# Patient Record
Sex: Female | Born: 1935 | Race: White | Hispanic: No | Smoking: Never smoker
Health system: Southern US, Community
[De-identification: ages and names within clinical notes are randomized; demographics above are authoritative.]

## PROBLEM LIST (undated history)

## (undated) ENCOUNTER — Emergency Department: Admission: EM | Payer: Medicare Other | Source: Home / Self Care

## (undated) DIAGNOSIS — J45909 Unspecified asthma, uncomplicated: Secondary | ICD-10-CM

## (undated) DIAGNOSIS — K219 Gastro-esophageal reflux disease without esophagitis: Secondary | ICD-10-CM

## (undated) DIAGNOSIS — M1990 Unspecified osteoarthritis, unspecified site: Secondary | ICD-10-CM

## (undated) HISTORY — PX: ABDOMINAL HYSTERECTOMY: SHX81

## (undated) HISTORY — PX: BRONCHOSCOPY: SUR163

## (undated) HISTORY — PX: BREAST SURGERY: SHX581

## (undated) HISTORY — PX: NASAL SINUS SURGERY: SHX719

---

## 2011-09-07 ENCOUNTER — Emergency Department (INDEPENDENT_AMBULATORY_CARE_PROVIDER_SITE_OTHER)
Admission: EM | Admit: 2011-09-07 | Discharge: 2011-09-07 | Disposition: A | Discharge status: Home / Self Care | Payer: Medicare Other | Source: Home / Self Care | Attending: Emergency Medicine | Admitting: Emergency Medicine

## 2011-09-07 ENCOUNTER — Encounter: Payer: Self-pay | Admitting: *Deleted

## 2011-09-07 DIAGNOSIS — S058X9A Other injuries of unspecified eye and orbit, initial encounter: Secondary | ICD-10-CM

## 2011-09-07 DIAGNOSIS — S0500XA Injury of conjunctiva and corneal abrasion without foreign body, unspecified eye, initial encounter: Secondary | ICD-10-CM

## 2011-09-07 HISTORY — DX: Unspecified osteoarthritis, unspecified site: M19.90

## 2011-09-07 MED ORDER — POLYMYXIN B-TRIMETHOPRIM 10000-0.1 UNIT/ML-% OP SOLN: 1.0000 [drp] | Freq: Four times a day (QID) | OPHTHALMIC | Status: AC

## 2011-09-07 NOTE — ED Notes (Signed)
Pt c/o possible foreign body LT eye x last night. She has a hx of bilateral cataract.

## 2011-09-07 NOTE — ED Provider Notes (Signed)
History     CSN: [id number] Arrival date & time: 09/07/2011 10:23 AM   First MD Initiated Contact with Patient 09/07/11 1013      Chief Complaint  Patient presents with  . Eye Injury    (Consider location/radiation/quality/duration/timing/severity/associated sxs/prior treatment) HPI This patient is here complaining of a possible foreign body in her left eye since last night. She was cleaning in her room and feels that maybe some dust got in her eye. She does have a history of bilateral cataracts. She noticed a little bit of discomfort and some redness this morning and is wondering if the object is still and dry. She does not complain of any blurry vision or pain.  She does wear glasses for reading, however she would like a referral to see an ophthalmologist to talk about her cataracts.  Past Medical History  Diagnosis Date  . Allergic arthritis   . Osteoarthritis   . Cataract     bilateral    Past Surgical History  Procedure Date  . Abdominal hysterectomy   . Bronchoscopy   . Breast surgery     2 biopsy    Family History  Problem Relation Age of Onset  . Alzheimer's disease Mother   . Osteoarthritis Mother     History  Substance Use Topics  . Smoking status: Never Smoker   . Smokeless tobacco: Not on file  . Alcohol Use: No    OB History    Grav Para Term Preterm Abortions TAB SAB Ect Mult Living                  Review of Systems  Allergies  Aspirin and Zithromax  Home Medications   Current Outpatient Rx  Name Route Sig Dispense Refill  . FLUTICASONE PROPIONATE  HFA 110 MCG/ACT IN AERO Inhalation Inhale 1 puff into the lungs 2 (two) times daily.      Marland Kitchen FORMOTEROL FUMARATE 12 MCG IN CAPS Inhalation Place 12 mcg into inhaler and inhale 2 (two) times daily.      Marland Kitchen HYPROMELLOSE 2.5 % OP SOLN  1 drop.      Marland Kitchen POLYMYXIN B-TRIMETHOPRIM 10000-0.1 UNIT/ML-% OP SOLN Ophthalmic Apply 1 drop to eye every 6 (six) hours. 10 mL 0    BP 125/72  Pulse 94   Temp(Src) 97.9 F (36.6 C) (Oral)  Resp 18  Ht 5\' 2"  (1.575 m)  Wt 132 lb 4 oz (59.988 kg)  BMI 24.19 kg/m2  SpO2 98%  Physical Exam  Nursing note and vitals reviewed. Constitutional: She is oriented to person, place, and time. She appears well-developed and well-nourished.  HENT:  Head: Normocephalic and atraumatic.  Eyes: EOM and lids are normal. Pupils are equal, round, and reactive to light. No foreign bodies found. Left conjunctiva is injected. Left conjunctiva has no hemorrhage.  Slit lamp exam:      The left eye shows corneal abrasion and fluorescein uptake. The left eye shows no corneal ulcer.         Small corneal abrasion at 6 o'clock.  No dendrites.  Neck: Neck supple.  Cardiovascular: Regular rhythm and normal heart sounds.   Pulmonary/Chest: Effort normal and breath sounds normal. No respiratory distress.  Neurological: She is alert and oriented to person, place, and time.  Skin: Skin is warm and dry.  Psychiatric: She has a normal mood and affect. Her speech is normal.    ED Course  Procedures (including critical care time)  Labs Reviewed - No data to display  No results found.   1. Corneal abrasion       MDM   This patient with a left very small corneal abrasion on fluorescein examination.  I have given her a prescription for Polytrim to use for the next few days to prevent any infection and to lubricate her eyes. In addition I have given her a referral to Dr. Jackquline Bosch ophthalmology to discuss her cataracts. If she is having any further problems she should either call us or call her eye doctor. However I do believe that her irritation should be resolved within a day or day and a half at the most.    Lily Kocher, MD 09/07/11 1044

## 2014-07-10 ENCOUNTER — Encounter: Payer: Self-pay | Admitting: Physician Assistant

## 2014-07-10 ENCOUNTER — Ambulatory Visit (INDEPENDENT_AMBULATORY_CARE_PROVIDER_SITE_OTHER): Payer: Medicare Other | Admitting: Physician Assistant

## 2014-07-10 VITALS — BP 112/65 | HR 90 | Ht 62.5 in | Wt 133.0 lb

## 2014-07-10 DIAGNOSIS — J4520 Mild intermittent asthma, uncomplicated: Secondary | ICD-10-CM

## 2014-07-10 DIAGNOSIS — H269 Unspecified cataract: Secondary | ICD-10-CM

## 2014-07-10 DIAGNOSIS — K219 Gastro-esophageal reflux disease without esophagitis: Secondary | ICD-10-CM

## 2014-07-10 DIAGNOSIS — R002 Palpitations: Secondary | ICD-10-CM

## 2014-07-10 DIAGNOSIS — J45909 Unspecified asthma, uncomplicated: Secondary | ICD-10-CM | POA: Insufficient documentation

## 2014-07-10 DIAGNOSIS — R000 Tachycardia, unspecified: Secondary | ICD-10-CM

## 2014-07-10 NOTE — Progress Notes (Signed)
   Subjective:    Patient ID: Brandy Wright, female    DOB: 1933/09/08, 80 y.o.   MRN: [id number]  HPI Pt is a 80 yo female who presents to the clinic with her daughter. She would like to establish care. She also would like to follow up after ER visit for palpitations on 07/06/2014. She went to ED because she felt like her pulse was racing. EKG, CXR was all normal. Pulse was a little elevated on arrival but quickly came done. Nothing was done or given to her. She has continue to check pulse and staying around 90-100. Does not feel any overt palpitations. Denies any CP, jaw pain, HA, vision changes.   .. Active Ambulatory Problems    Diagnosis Date Noted  . Cataracts, bilateral 07/10/2014  . Asthma, chronic 07/10/2014  . GERD (gastroesophageal reflux disease) 07/10/2014  . Palpitations 07/10/2014  . Tachycardia 07/10/2014   Resolved Ambulatory Problems    Diagnosis Date Noted  . No Resolved Ambulatory Problems   Past Medical History  Diagnosis Date  . Allergic arthritis   . Osteoarthritis   . Cataract    .Marland Kitchen Family History  Problem Relation Age of Onset  . Alzheimer's disease Mother   . Osteoarthritis Mother   . Cancer Mother     colon  . Cancer Father     colon  . Multiple sclerosis Brother   . Myasthenia gravis Brother   . Myasthenia gravis Brother    .Marland Kitchen History   Social History  . Marital Status: Divorced    Spouse Name: N/A    Number of Children: N/A  . Years of Education: N/A   Occupational History  . Not on file.   Social History Main Topics  . Smoking status: Never Smoker   . Smokeless tobacco: Not on file  . Alcohol Use: No  . Drug Use: No  . Sexual Activity: Not Currently   Other Topics Concern  . Not on file   Social History Narrative  . No narrative on file       Review of Systems  All other systems reviewed and are negative.      Objective:   Physical Exam  Constitutional: She is oriented to person, place, and time. She appears  well-developed and well-nourished.  HENT:  Head: Normocephalic and atraumatic.  Cardiovascular: Normal rate, regular rhythm and normal heart sounds.   Pulmonary/Chest: Effort normal and breath sounds normal.  Neurological: She is alert and oriented to person, place, and time.  Skin: Skin is dry.  Psychiatric: She has a normal mood and affect. Her behavior is normal.          Assessment & Plan:  Palpitations/Tachycardia- reassured pt that pulse looked great today. Will get holter monitor for further confirmation.   Asthma- controlled and managed by specialist.   Pt declined flu shot, Tdap, colonoscopy, and mammogram.   Risk were discussed and pt assumes them.

## 2014-07-22 ENCOUNTER — Encounter (INDEPENDENT_AMBULATORY_CARE_PROVIDER_SITE_OTHER): Payer: Medicare Other

## 2014-07-22 ENCOUNTER — Encounter: Payer: Self-pay | Admitting: *Deleted

## 2014-07-22 DIAGNOSIS — R000 Tachycardia, unspecified: Secondary | ICD-10-CM

## 2014-07-22 DIAGNOSIS — R002 Palpitations: Secondary | ICD-10-CM

## 2014-07-22 NOTE — Progress Notes (Signed)
Patient ID: Brandy Wright, female   DOB: 17-May-1933, 80 y.o.   MRN: [id number] [employer] 24 hour holter monitor applied to patient.

## 2014-08-07 ENCOUNTER — Telehealth: Payer: Self-pay | Admitting: Physician Assistant

## 2014-08-07 NOTE — Telephone Encounter (Signed)
Brandy Wright will you call pt: make her aware holter monitor result are in. Showed normal sinus rhythm with rare premature ventricular contractions. This is not worrisome. Certainly staying away from caffiene, alcohol could help. There are medicines that can help if PVC become worrisome to you. Please follow up with any questions.

## 2014-08-19 NOTE — Telephone Encounter (Signed)
Pt notified of results

## 2014-10-16 ENCOUNTER — Ambulatory Visit (INDEPENDENT_AMBULATORY_CARE_PROVIDER_SITE_OTHER): Payer: Medicare Other | Admitting: Physician Assistant

## 2014-10-16 ENCOUNTER — Encounter: Payer: Self-pay | Admitting: Physician Assistant

## 2014-10-16 VITALS — BP 122/57 | HR 92 | Ht 62.5 in | Wt 133.0 lb

## 2014-10-16 DIAGNOSIS — Z131 Encounter for screening for diabetes mellitus: Secondary | ICD-10-CM

## 2014-10-16 DIAGNOSIS — K2100 Gastro-esophageal reflux disease with esophagitis, without bleeding: Secondary | ICD-10-CM

## 2014-10-16 DIAGNOSIS — Z0000 Encounter for general adult medical examination without abnormal findings: Secondary | ICD-10-CM

## 2014-10-16 DIAGNOSIS — Z13220 Encounter for screening for lipoid disorders: Secondary | ICD-10-CM

## 2014-10-16 MED ORDER — PANTOPRAZOLE SODIUM 40 MG PO TBEC: 40.0000 mg | DELAYED_RELEASE_TABLET | Freq: Every day | ORAL | Status: DC

## 2014-10-16 NOTE — Progress Notes (Signed)
   Subjective:    Patient ID: Brandy Wright, female    DOB: Jul 08, 1933, 80 y.o.   MRN: [id number]  HPI  Patient presents to the clinic to discuss acid reflux. She is currently taking antacids multiple times during the day to help with her acid reflux symptoms. She denies any overt abdominal pain. She occasionally feels a little belching or gas see if that pain. She denies any blood in stool or black tarry stools. She has not been wanting to take omeprazole because she feels like it does speed up her heart. She would like to try something else.  She is also concerned because she is running out of her asthma inhalers due to cost. She sees Dr. Vickki Muff her pulmonologist. She is called their office and no answer. She feels like there might be something cheaper that she can get to control her symptoms.    Review of Systems  All other systems reviewed and are negative.      Objective:   Physical Exam  Constitutional: She is oriented to person, place, and time. She appears well-developed and well-nourished.  HENT:  Head: Normocephalic and atraumatic.  Cardiovascular: Normal rate, regular rhythm and normal heart sounds.   Pulmonary/Chest: Effort normal and breath sounds normal.  Abdominal: Soft. Bowel sounds are normal. She exhibits no distension and no mass. There is no tenderness. There is no rebound and no guarding.  Neurological: She is alert and oriented to person, place, and time.  Skin: Skin is dry.  Psychiatric: She has a normal mood and affect. Her behavior is normal.          Assessment & Plan:  Asthma- she's been followed by Dr. Vickki Muff in currently controlled. Patient is concerned because she will run out of her medications and inhalers on Sunday. She has tried calling their office. The cost of these medicines will be too much in the new year and she wonders if there is an alternative. I discussed with her that we will call Dr. Alvera Singh office and see if there is anything they can  send that is comparable so that she will not have to run out her medications. If not I did suggest a follow-up in person with Dr. Vickki Muff.  GERD-patient is very anxious about starting or taking any new medication. She is using a lot of antacids throughout the day. I discussed with her that one tablet in the morning might be just what she needs to use the over-the-counter antacids throughout the day. We will try protonic since on her formulary. Follow-up as needed. I did give patient a GERD diet to help control some of her symptoms.  We did discuss some preventative healthcare. She once again declined mammogram/colonoscopy/flu shot/tetanus shot. I did order some fasting labs and encouraged her to have these drawn at her convenience and follow-up.

## 2014-10-16 NOTE — Patient Instructions (Addendum)
Will call [facility] about preferred asthma drugs.   Food Choices for Gastroesophageal Reflux Disease When you have gastroesophageal reflux disease (GERD), the foods you eat and your eating habits are very important. Choosing the right foods can help ease the discomfort of GERD. WHAT GENERAL GUIDELINES DO I NEED TO FOLLOW?  Choose fruits, vegetables, whole grains, low-fat dairy products, and low-fat meat, fish, and poultry.  Limit fats such as oils, salad dressings, butter, nuts, and avocado.  Keep a food diary to identify foods that cause symptoms.  Avoid foods that cause reflux. These may be different for different people.  Eat frequent small meals instead of three large meals each day.  Eat your meals slowly, in a relaxed setting.  Limit fried foods.  Cook foods using methods other than frying.  Avoid drinking alcohol.  Avoid drinking large amounts of liquids with your meals.  Avoid bending over or lying down until 2-3 hours after eating. WHAT FOODS ARE NOT RECOMMENDED? The following are some foods and drinks that may worsen your symptoms: Vegetables Tomatoes. Tomato juice. Tomato and spaghetti sauce. Chili peppers. Onion and garlic. Horseradish. Fruits Oranges, grapefruit, and lemon (fruit and juice). Meats High-fat meats, fish, and poultry. This includes hot dogs, ribs, ham, sausage, salami, and bacon. Dairy Whole milk and chocolate milk. Sour cream. Cream. Butter. Ice cream. Cream cheese.  Beverages Coffee and tea, with or without caffeine. Carbonated beverages or energy drinks. Condiments Hot sauce. Barbecue sauce.  Sweets/Desserts Chocolate and cocoa. Donuts. Peppermint and spearmint. Fats and Oils High-fat foods, including [place] fries and potato chips. Other Vinegar. Strong spices, such as black pepper, white pepper, red pepper, cayenne, curry powder, cloves, ginger, and chili powder. The items listed above may not be a complete list of foods and  beverages to avoid. Contact your dietitian for more information. Document Released: 10/04/2005 Document Revised: 10/09/2013 Document Reviewed: 08/08/2013 [facility] Patient Information 2015 [place], [place]. This information is not intended to replace advice given to you by your health care provider. Make sure you discuss any questions you have with your health care provider.

## 2015-01-08 ENCOUNTER — Ambulatory Visit (INDEPENDENT_AMBULATORY_CARE_PROVIDER_SITE_OTHER): Payer: Medicare Other | Admitting: Physician Assistant

## 2015-01-08 ENCOUNTER — Encounter: Payer: Self-pay | Admitting: Physician Assistant

## 2015-01-08 VITALS — BP 109/56 | HR 97 | Ht 62.5 in | Wt 127.0 lb

## 2015-01-08 DIAGNOSIS — R000 Tachycardia, unspecified: Secondary | ICD-10-CM | POA: Diagnosis not present

## 2015-01-08 DIAGNOSIS — J4520 Mild intermittent asthma, uncomplicated: Secondary | ICD-10-CM

## 2015-01-08 MED ORDER — ALBUTEROL SULFATE HFA 108 (90 BASE) MCG/ACT IN AERS: 2.0000 | INHALATION_SPRAY | Freq: Four times a day (QID) | RESPIRATORY_TRACT | Status: DC | PRN

## 2015-01-08 MED ORDER — FLUTICASONE PROPIONATE HFA 110 MCG/ACT IN AERO: 2.0000 | INHALATION_SPRAY | Freq: Two times a day (BID) | RESPIRATORY_TRACT | Status: DC

## 2015-01-09 LAB — LIPID PANEL
Cholesterol: 170 mg/dL (ref 0–200)
HDL: 41 mg/dL — ABNORMAL LOW (ref >=46)
LDL Cholesterol: 109 mg/dL — ABNORMAL HIGH (ref 0–99)
Total CHOL/HDL Ratio: 4.1 Ratio
Triglycerides: 101 mg/dL (ref <150)
VLDL: 20 mg/dL (ref 0–40)

## 2015-01-09 LAB — COMPLETE METABOLIC PANEL WITH GFR
ALT: 10 U/L (ref 0–35)
AST: 17 U/L (ref 0–37)
Albumin: 3.9 g/dL (ref 3.5–5.2)
Alkaline Phosphatase: 86 U/L (ref 39–117)
BUN: 6 mg/dL (ref 6–23)
CO2: 32 mEq/L (ref 19–32)
Calcium: 8.9 mg/dL (ref 8.4–10.5)
Chloride: 101 mEq/L (ref 96–112)
Creat: 0.63 mg/dL (ref 0.50–1.10)
GFR, Est African American: >89 mL/min
GFR, Est Non African American: 86 mL/min
Glucose, Bld: 73 mg/dL (ref 70–99)
Potassium: 4.4 mEq/L (ref 3.5–5.3)
Sodium: 141 mEq/L (ref 135–145)
Total Bilirubin: 1.3 mg/dL — ABNORMAL HIGH (ref 0.2–1.2)
Total Protein: 6.4 g/dL (ref 6.0–8.3)

## 2015-01-10 NOTE — Progress Notes (Signed)
   Subjective:    Patient ID: Brandy Wright, female    DOB: Mar 10, 1933, 81 y.o.   MRN: [id number]  HPI Pt is a 81 yo woman who presents to the clinic for medication refills on asthma medications. She is doing well currently on flovent 1 puff bid and albuterol as needed. Not using albuterol every day but new season has caused her to use a little more. No SOB or cough today.   She does mention her elevated pulse at times makes her very anxious. She does not know any triggers. No CP, headaches, vision changes, or SOB with pulse. She does do relaxation techniques.    Review of Systems  All other systems reviewed and are negative.      Objective:   Physical Exam  Constitutional: She is oriented to person, place, and time. She appears well-developed and well-nourished.  HENT:  Head: Normocephalic and atraumatic.  Cardiovascular: Normal rate, regular rhythm and normal heart sounds.   Pulmonary/Chest: Effort normal and breath sounds normal. She has no wheezes.  Neurological: She is alert and oriented to person, place, and time.  Skin: Skin is dry.  Psychiatric: She has a normal mood and affect. Her behavior is normal.          Assessment & Plan:  Asthma- refilled medications. Discussed during bad season can go up to 2 puffs twice a day on flovent. Use albuterol as least as you can. Encouraged to add a daily zyrtec during the spring season at night without D.   Tachycardia- explained she could be getting elevated HR after using albuterol. Certainly today not too concerning. Start keeping a log and if we need to can start medication to lower pulse to help decrease anxiety. She brings up valium for anxiety but i would like to stay away from this medication at this time due to age and her living alone. Bring in logs and can elavalute pulse more.

## 2015-04-18 ENCOUNTER — Ambulatory Visit (INDEPENDENT_AMBULATORY_CARE_PROVIDER_SITE_OTHER): Payer: Medicare Other | Admitting: Family Medicine

## 2015-04-18 ENCOUNTER — Encounter: Payer: Self-pay | Admitting: Family Medicine

## 2015-04-18 VITALS — BP 122/63 | HR 88 | Wt 123.0 lb

## 2015-04-18 DIAGNOSIS — R0981 Nasal congestion: Secondary | ICD-10-CM

## 2015-04-18 DIAGNOSIS — K219 Gastro-esophageal reflux disease without esophagitis: Secondary | ICD-10-CM | POA: Diagnosis not present

## 2015-04-18 MED ORDER — IPRATROPIUM BROMIDE 0.03 % NA SOLN: 2.0000 | Freq: Two times a day (BID) | NASAL | Status: DC

## 2015-04-18 NOTE — Progress Notes (Signed)
CC: Brandy Wright is a 81 y.o. female is here for f/u acid refux   Subjective: HPI:  Follow-up GERD: She decided not to take Protonix due to a fear that it would cause side effects similar to that shakes during some omeprazole. Omeprazole causes a racing heartbeat with a resting heartbeat of greater than 100. Once she stopped taking his medication the heart rate back into the normal range below 100 when resting. She tells me she gets a burning sensation on the back of her sternum that radiates up into the back of the mouth if she lies down flat. Symptoms are slightly improved if she takes over-the-counter acid suppression medication that sounds to be like tums but not ranitidine. Symptoms are currently mild in severity and present most days of the week. She denies abdominal pain, unintentional weight loss appetite suppression or change in voice.  Her major complaint today is nasal congestion has been present for matter of months. She tells me she uses somewhere around 100 tissues a day for this. It's draining only through the front and seems to block her right nostril more than the left. Nothing seems to make it better or worse. She's tried Nasonex, Nasacort and just started Flonase to see if this would help, she is all of these individually never together. She denies fevers, chills, cough nor shortness of breath   Review Of Systems Outlined In HPI  Past Medical History  Diagnosis Date  . Allergic arthritis   . Osteoarthritis   . Cataract     bilateral    Past Surgical History  Procedure Laterality Date  . Abdominal hysterectomy    . Bronchoscopy    . Breast surgery      2 biopsy   Family History  Problem Relation Age of Onset  . Alzheimer's disease Mother   . Osteoarthritis Mother   . Cancer Mother     colon  . Cancer Father     colon  . Multiple sclerosis Brother   . Myasthenia gravis Brother   . Myasthenia gravis Brother     History   Social History  . Marital Status:  Divorced    Spouse Name: N/A  . Number of Children: N/A  . Years of Education: N/A   Occupational History  . Not on file.   Social History Main Topics  . Smoking status: Never Smoker   . Smokeless tobacco: Not on file  . Alcohol Use: No  . Drug Use: No  . Sexual Activity: Not Currently   Other Topics Concern  . Not on file   Social History Narrative     Objective: BP 122/63 mmHg  Pulse 88  Wt 123 lb (55.792 kg)  General: Alert and Oriented, No Acute Distress HEENT: Pupils equal, round, reactive to light. Conjunctivae clear.  External ears unremarkable, canals clear with intact TMs with appropriate landmarks.  Middle ear appears open without effusion. Pink inferior turbinates.  Moist mucous membranes, pharynx without inflammation nor lesions.  Neck supple without palpable lymphadenopathy nor abnormal masses. Lungs: Clear comfortable work of breathing Cardiac: Regular rate and rhythm.  Extremities: No peripheral edema.  Strong peripheral pulses.  Mental Status: No depression, anxiety, nor agitation. Skin: Warm and dry.  Assessment & Plan: Brandy Wright was seen today for f/u acid refux.  Diagnoses and all orders for this visit:  Gastroesophageal reflux disease, esophagitis presence not specified  Nasal congestion Orders: -     ipratropium (ATROVENT) 0.03 % nasal spray; Place 2 sprays into both  nostrils every 12 (twelve) hours.   GERD: Uncontrolled chronic condition, she essentially declines all PPIs. She is not interested in ranitidine. She prefers to continue with over-the-counter Tums. Nasal congestion: Start Atrovent this can be taken with any nasal cortical steroidal of her choosing.  Return if symptoms worsen or fail to improve.

## 2015-06-30 ENCOUNTER — Encounter: Payer: Self-pay | Admitting: Physician Assistant

## 2015-06-30 ENCOUNTER — Ambulatory Visit (INDEPENDENT_AMBULATORY_CARE_PROVIDER_SITE_OTHER): Payer: Medicare Other | Admitting: Physician Assistant

## 2015-06-30 VITALS — BP 140/65 | HR 97 | Temp 97.7°F | Ht 62.5 in | Wt 125.0 lb

## 2015-06-30 DIAGNOSIS — R0981 Nasal congestion: Secondary | ICD-10-CM | POA: Diagnosis not present

## 2015-06-30 DIAGNOSIS — H6123 Impacted cerumen, bilateral: Secondary | ICD-10-CM

## 2015-06-30 DIAGNOSIS — J0100 Acute maxillary sinusitis, unspecified: Secondary | ICD-10-CM

## 2015-06-30 MED ORDER — AMOXICILLIN 500 MG PO CAPS: 500.0000 mg | ORAL_CAPSULE | Freq: Three times a day (TID) | ORAL | Status: DC

## 2015-06-30 MED ORDER — METHYLPREDNISOLONE SODIUM SUCC 125 MG IJ SOLR
125.0000 mg | Freq: Once | INTRAMUSCULAR | Status: AC
Start: 2015-06-30 — End: 2015-06-30
  Administered 2015-06-30: 125 mg via INTRAMUSCULAR

## 2015-06-30 MED ORDER — FLUTICASONE PROPIONATE HFA 110 MCG/ACT IN AERO: 2.0000 | INHALATION_SPRAY | Freq: Two times a day (BID) | RESPIRATORY_TRACT | Status: DC

## 2015-06-30 MED ORDER — BECLOMETHASONE DIPROPIONATE 80 MCG/ACT NA AERS: INHALATION_SPRAY | NASAL | Status: DC

## 2015-06-30 NOTE — Addendum Note (Signed)
Addended by: Beatris Ship L on: 06/30/2015 11:26 AM   Modules accepted: Orders

## 2015-06-30 NOTE — Addendum Note (Signed)
Addended by: Beatris Ship L on: 06/30/2015 10:54 AM   Modules accepted: Orders

## 2015-06-30 NOTE — Progress Notes (Addendum)
   Subjective:    Patient ID: Brandy Wright, female    DOB: 03-25-1933, 81 y.o.   MRN: [id number]  HPI CC: nasal congestion  Patient presents to clinic with nasal congestion lasting "a few weeks". She reports pressure in maxillary sinuses and ears. She denies symptoms in frontal sinuses. She states she frequently expels mucus which is yellow/green and thick in quality. She reports symptoms are accompanied by chest tightness. OTC antihistamines provide mild relief. She was seen at onset of symptoms and prescribed atrovent, which she is not taking due to side effects of dizziness. She denies chest pain, SOB, cough, or sore throat.   .. Past Medical History  Diagnosis Date  . Allergic arthritis   . Osteoarthritis   . Cataract     bilateral    Review of Systems    Positive for symptoms listed in HPI. Objective:   Physical Exam  Constitutional: Patient is alert, oriented and cooperative. Ears: Right TM is erythematous with significant cerumen present in external cannel. Left external cannel is completely occluded by cerumen after removal. TM's erythematous with some fluid behind TM. No blood or pus.  Nose: Nares mildly occluded. Nasal mucosa erythematous with yellow mucus observed. Tenderness over bilateral maxillary sinuses.  Throat: Pharynx is not erythematous and without exudate Lymph: No nodes palpated Cardiac: S1, S2 auscultated. No M/R/G Pulm: CTAB       Assessment & Plan:  Sinusitis- Patient reports congestion with yellow discharge. On PE, maxillary sinuses were tender to palpation and erythematous nasal mucosa observed. Administer injection of solumedrol '125mg'$  IM.  Rx amox.icillin for 10 days given.   Nasal congestion- qnasl given sample to try.   Cerumen impaction- Clean out external cannel with saline rinse bilaterally   Asthma- refilled flovent.

## 2015-06-30 NOTE — Patient Instructions (Signed)
Try qnasl.  Start amoxil.  Continue allergy medications.  Call if not improving will consider CT scan.

## 2015-12-09 ENCOUNTER — Encounter: Payer: Self-pay | Admitting: Physician Assistant

## 2015-12-09 ENCOUNTER — Ambulatory Visit (INDEPENDENT_AMBULATORY_CARE_PROVIDER_SITE_OTHER): Payer: Medicare Other | Admitting: Physician Assistant

## 2015-12-09 VITALS — BP 104/48 | HR 104 | Temp 97.9°F | Ht 62.5 in | Wt 118.0 lb

## 2015-12-09 DIAGNOSIS — R0981 Nasal congestion: Secondary | ICD-10-CM

## 2015-12-09 DIAGNOSIS — J0100 Acute maxillary sinusitis, unspecified: Secondary | ICD-10-CM | POA: Diagnosis not present

## 2015-12-09 MED ORDER — AMOXICILLIN-POT CLAVULANATE 875-125 MG PO TABS: 1.0000 | ORAL_TABLET | Freq: Two times a day (BID) | ORAL | Status: DC

## 2015-12-09 MED ORDER — AZELASTINE HCL 0.1 % NA SOLN: 2.0000 | Freq: Two times a day (BID) | NASAL | Status: DC

## 2015-12-09 NOTE — Patient Instructions (Signed)
Azelastine nasal spray What is this medicine? AZELASTINE (a ZEL as teen) nasal spray is a histamine blocker. It helps to relieve itching, running and stuffiness in the nose. This medicine is used to treat nasal symptoms from allergies and other irritants. This medicine may be used for other purposes; ask your health care provider or pharmacist if you have questions. What should I tell my health care provider before I take this medicine? They need to know if you have any of these conditions: -any other medical problems -an unusual or allergic reaction to azelastine, other nasal sprays, medicines, foods, dyes, or preservatives -pregnant or trying to become pregnant -breast-feeding How should I use this medicine? This medicine is for use only in the nose. Follow the directions on the prescription label. Do not use more often than directed. Make sure that you are using your inhaler correctly. Ask you doctor or health care provider if you have any questions. Talk to your pediatrician regarding the use of this medicine in children. While some products may be prescribed for children as young as 6 months for selected conditions, precautions do apply. Overdosage: If you think you have taken too much of this medicine contact a poison control center or emergency room at once. NOTE: This medicine is only for you. Do not share this medicine with others. What if I miss a dose? If you miss a dose, use it as soon as you can. If it is almost time for your next dose, use only that dose. Do not use double or extra doses. What may interact with this medicine? -cimetidine -other antihistamines This list may not describe all possible interactions. Give your health care provider a list of all the medicines, herbs, non-prescription drugs, or dietary supplements you use. Also tell them if you smoke, drink alcohol, or use illegal drugs. Some items may interact with your medicine. What should I watch for while using this  medicine? Tell your doctor or health care professional if your symptoms do not improve. Do not use extra medicine. Do not spray in your eyes. This medicine may make you feel confused, dizzy or lightheaded. Drinking alcohol or taking medicine that causes drowsiness can make this worse. Do not drive, use machinery, or do anything that needs mental alertness until you know how this medicine affects you. What side effects may I notice from receiving this medicine? Side effects that you should report to your doctor or health care professional as soon as possible: -allergic reactions like skin rash, itching or hives, swelling of the face, lips, or tongue -breathing problems -fast heartbeat -high blood pressure -infection Side effects that usually do not require medical attention (report to your doctor or health care professional if they continue or are bothersome): -bitter taste -cough -feeling tired -headache -larger appetite or weight gain -nose or throat irritation -nosebleed -sneezing This list may not describe all possible side effects. Call your doctor for medical advice about side effects. You may report side effects to FDA at 1-800-FDA-1088. Where should I keep my medicine? Keep out of the reach of children. Store upright and tightly closed at room temperature between 20 and 25 degrees C (68 and 77 degrees F). Do not freeze. Throw away any unused medicine after the expiration date or after 200 sprays, whichever comes first. NOTE: This sheet is a summary. It may not cover all possible information. If you have questions about this medicine, talk to your doctor, pharmacist, or health care provider.    2016, Elsevier/Gold Standard. (  2013-12-11 15:52:44)  

## 2015-12-09 NOTE — Progress Notes (Addendum)
   Subjective:    Patient ID: Brandy Wright, female    DOB: 08-22-1933, 82 y.o.   MRN: [id number]  HPI  Patient is an 82 year old female that presents with nasal congestion for the past two weeks. Patient has also had a non-productive cough and sore throat. Patient has a past medical history relevant for recurrent sinusitis. Patient experiences occasional shortness of breath, which she associates with asthma. Patient has occasional non-purulent rhinorrhea. Patient denies body aches but has felt tired. Patient denies fever and chills. Patient denies chest pain, nausea, vomiting, dysuria, hematuria, constipation and diarrhea. Patient has tried phenylephrine, which has provided her with some relief. Patient states that phenylephrine makes her feel "disoriented".   Review of Systems   Please see HPI Objective:   Physical Exam  Constitutional: She is oriented to person, place, and time. She appears well-developed and well-nourished.  HENT:  Head: Normocephalic and atraumatic.  Right Ear: External ear normal.  Left Ear: External ear normal.  Patient's TMs are traunslucent with bony landmarks visualized.  Patient's posterior pharynx is erythematous with cobblestoning visualized.   Bilateral turbinates pale and swollen.  Eyes: Conjunctivae and EOM are normal. Pupils are equal, round, and reactive to light.  Neck: Normal range of motion. No thyromegaly present.  Cardiovascular: Normal rate, regular rhythm, normal heart sounds and intact distal pulses.   Pulmonary/Chest: Effort normal and breath sounds normal. No respiratory distress. She has no wheezes. She has no rales.  Abdominal: Soft. Bowel sounds are normal. She exhibits no distension. There is no tenderness.  Musculoskeletal: Normal range of motion.  Neurological: She is alert and oriented to person, place, and time. She has normal reflexes.  Skin: Skin is warm and dry.  Psychiatric: She has a normal mood and affect. Her behavior is  normal. Judgment and thought content normal.     Assessment & Plan:   1. Acute Sinusitis with nasal congestion Patient has had nasal congestion and fatigue. Patient has a past medical history of recurrent sinusitis. Stop sudafed. Patient was treated with 875 mg /125 mg PO q12h for 7 days. Patient was also started on Astelin nasal spray. Patient was advised to follow up for a nurse's visit on 12/12/15 for a solumedrol injection if symptoms do not improve.

## 2016-02-26 ENCOUNTER — Encounter: Payer: Self-pay | Admitting: Physician Assistant

## 2016-02-26 ENCOUNTER — Ambulatory Visit (INDEPENDENT_AMBULATORY_CARE_PROVIDER_SITE_OTHER): Payer: Medicare Other | Admitting: Physician Assistant

## 2016-02-26 VITALS — BP 126/40 | HR 97 | Ht 62.5 in | Wt 117.0 lb

## 2016-02-26 DIAGNOSIS — R0981 Nasal congestion: Secondary | ICD-10-CM | POA: Diagnosis not present

## 2016-02-26 MED ORDER — METHYLPREDNISOLONE SODIUM SUCC 125 MG IJ SOLR
125.0000 mg | Freq: Once | INTRAMUSCULAR | Status: AC
  Administered 2016-02-26: 125 mg via INTRAMUSCULAR

## 2016-02-26 NOTE — Patient Instructions (Signed)
Zyrtec daily.   Referral to ENT.   If you are not improving suggest CT scan of sinuses to look for chronic sinusitis.

## 2016-02-26 NOTE — Progress Notes (Addendum)
   Subjective:    Patient ID: Brandy Wright, female    DOB: [birth year], 82 y.o.   MRN: [id number]  HPI Patient is here today complaining of ongoing congestion for the past several months. She was seen in 12/09/15, diagnosed with a maxillary sinusitis, and given Augmentin for treatment. She states she never got completely better after that antibiotic, and has continued to struggle with nasal congestion. Denies fever, chills, headache, sore throat, chest tightness, shortness of breath and wheezing. She does reports some ear pressure. She has not tried any medicine at home, but has been using the Azelastine inhaler twice daily with some improvement.    Review of Systems  All other systems reviewed and are negative.      Objective:   Physical Exam  Constitutional: She is oriented to person, place, and time. She appears well-developed and well-nourished.  HENT:  Head: Normocephalic and atraumatic.  Left Ear: External ear normal.  Nose: Mucosal edema and rhinorrhea present. Right sinus exhibits frontal sinus tenderness. Right sinus exhibits no maxillary sinus tenderness. Left sinus exhibits frontal sinus tenderness. Left sinus exhibits no maxillary sinus tenderness.  Mouth/Throat: Posterior oropharyngeal erythema present. No oropharyngeal exudate.  Cardiovascular: Normal rate, regular rhythm and normal heart sounds.   Pulmonary/Chest: Effort normal and breath sounds normal. No respiratory distress. She has no wheezes. She has no rales. She exhibits no tenderness.  Lymphadenopathy:    She has no cervical adenopathy.  Neurological: She is alert and oriented to person, place, and time.  Skin: Skin is warm and dry.          Assessment & Plan:  1. Nasal congestion- Patient presents with ongoing nasal congestion since being treated with Augmentin for sinusitis in February. I believe this is allergy related. Solumedrol 125 given IM today in the office. If no improvement, we will do Head CT to  rule out chronic sinusitis.

## 2016-03-01 ENCOUNTER — Other Ambulatory Visit: Payer: Self-pay | Admitting: *Deleted

## 2016-03-01 MED ORDER — MOMETASONE FUROATE 50 MCG/ACT NA SUSP: 2.0000 | Freq: Every day | NASAL | Status: DC

## 2016-06-06 ENCOUNTER — Emergency Department (HOSPITAL_BASED_OUTPATIENT_CLINIC_OR_DEPARTMENT_OTHER): Payer: Medicare Other

## 2016-06-06 ENCOUNTER — Other Ambulatory Visit: Payer: Self-pay

## 2016-06-06 ENCOUNTER — Emergency Department (HOSPITAL_BASED_OUTPATIENT_CLINIC_OR_DEPARTMENT_OTHER)
Admission: EM | Admit: 2016-06-06 | Discharge: 2016-06-06 | Disposition: A | Discharge status: Home / Self Care | Payer: Medicare Other | Attending: Emergency Medicine | Admitting: Emergency Medicine

## 2016-06-06 ENCOUNTER — Encounter (HOSPITAL_BASED_OUTPATIENT_CLINIC_OR_DEPARTMENT_OTHER): Payer: Self-pay | Admitting: *Deleted

## 2016-06-06 DIAGNOSIS — R079 Chest pain, unspecified: Secondary | ICD-10-CM | POA: Diagnosis present

## 2016-06-06 DIAGNOSIS — R05 Cough: Secondary | ICD-10-CM | POA: Insufficient documentation

## 2016-06-06 DIAGNOSIS — Z7951 Long term (current) use of inhaled steroids: Secondary | ICD-10-CM | POA: Diagnosis not present

## 2016-06-06 DIAGNOSIS — R0981 Nasal congestion: Secondary | ICD-10-CM | POA: Insufficient documentation

## 2016-06-06 DIAGNOSIS — J3489 Other specified disorders of nose and nasal sinuses: Secondary | ICD-10-CM | POA: Diagnosis not present

## 2016-06-06 DIAGNOSIS — J45909 Unspecified asthma, uncomplicated: Secondary | ICD-10-CM | POA: Diagnosis not present

## 2016-06-06 DIAGNOSIS — R0982 Postnasal drip: Secondary | ICD-10-CM | POA: Diagnosis not present

## 2016-06-06 HISTORY — DX: Unspecified asthma, uncomplicated: J45.909

## 2016-06-06 HISTORY — DX: Gastro-esophageal reflux disease without esophagitis: K21.9

## 2016-06-06 LAB — COMPREHENSIVE METABOLIC PANEL
ALT: 12 U/L — ABNORMAL LOW (ref 14–54)
AST: 20 U/L (ref 15–41)
Albumin: 3.9 g/dL (ref 3.5–5.0)
Alkaline Phosphatase: 55 U/L (ref 38–126)
Anion gap: 8 (ref 5–15)
BUN: 16 mg/dL (ref 6–20)
CO2: 29 mmol/L (ref 22–32)
Calcium: 9.2 mg/dL (ref 8.9–10.3)
Chloride: 101 mmol/L (ref 101–111)
Creatinine, Ser: 0.68 mg/dL (ref 0.44–1.00)
GFR calc Af Amer: >60 mL/min (ref >60)
GFR calc non Af Amer: >60 mL/min (ref >60)
Glucose, Bld: 88 mg/dL (ref 65–99)
Potassium: 4 mmol/L (ref 3.5–5.1)
Sodium: 138 mmol/L (ref 135–145)
Total Bilirubin: 1.7 mg/dL — ABNORMAL HIGH (ref 0.3–1.2)
Total Protein: 6.9 g/dL (ref 6.5–8.1)

## 2016-06-06 LAB — URINALYSIS, ROUTINE W REFLEX MICROSCOPIC
Bilirubin Urine: NEGATIVE
Glucose, UA: NEGATIVE mg/dL
Hgb urine dipstick: NEGATIVE
Ketones, ur: NEGATIVE mg/dL
Leukocytes, UA: NEGATIVE
Nitrite: NEGATIVE
Protein, ur: NEGATIVE mg/dL
Specific Gravity, Urine: 1.011 (ref 1.005–1.030)
pH: 7.5 (ref 5.0–8.0)

## 2016-06-06 LAB — CBC WITH DIFFERENTIAL/PLATELET
Basophils Absolute: 0 10*3/uL (ref 0.0–0.1)
Basophils Relative: 1 %
Eosinophils Absolute: 0.3 10*3/uL (ref 0.0–0.7)
Eosinophils Relative: 7 %
HCT: 42.5 % (ref 36.0–46.0)
Hemoglobin: 14 g/dL (ref 12.0–15.0)
Lymphocytes Relative: 16 %
Lymphs Abs: 0.7 10*3/uL (ref 0.7–4.0)
MCH: 29.6 pg (ref 26.0–34.0)
MCHC: 32.9 g/dL (ref 30.0–36.0)
MCV: 89.9 fL (ref 78.0–100.0)
Monocytes Absolute: 0.4 10*3/uL (ref 0.1–1.0)
Monocytes Relative: 8 %
Neutro Abs: 2.9 10*3/uL (ref 1.7–7.7)
Neutrophils Relative %: 68 %
Platelets: 209 10*3/uL (ref 150–400)
RBC: 4.73 MIL/uL (ref 3.87–5.11)
RDW: 13.5 % (ref 11.5–15.5)
WBC: 4.3 10*3/uL (ref 4.0–10.5)

## 2016-06-06 LAB — TROPONIN I
Troponin I: <0.03 ng/mL (ref <0.03)
Troponin I: <0.03 ng/mL (ref <0.03)

## 2016-06-06 LAB — LIPASE, BLOOD: Lipase: 26 U/L (ref 11–51)

## 2016-06-06 MED ORDER — DOXYCYCLINE HYCLATE 100 MG PO CAPS: 100.0000 mg | ORAL_CAPSULE | Freq: Two times a day (BID) | ORAL | 0 refills | Status: DC

## 2016-06-06 NOTE — ED Triage Notes (Signed)
Patient states she got up this morning and used her inhaler.  States she had a sudden tightness in the central chest, which has continued but is improving now.  States she has asthma and has tightness in her chest, which is a different tightness than her normal tightness.

## 2016-06-06 NOTE — Discharge Instructions (Signed)
There is no evidence of heart attack. Followup with your doctor for a stress test. Return to the ED if you develop new or worsening symptoms.

## 2016-06-06 NOTE — ED Notes (Signed)
Pt and family given d/c instructions as per chart. Verbalizes understanding. No questions. Rx x 1

## 2016-06-06 NOTE — ED Provider Notes (Signed)
[facility] DEPT [facility] Provider Note   CSN: [id number] Arrival date & time: 06/06/16  1050     History   Chief Complaint Chief Complaint  Patient presents with  . Chest Pain    HPI Brandy Wright is a 82 y.o. female.  Patient presents with tightness in her chest that onset after she used her Flovent inhaler this morning. Incontinence approximately 10 AM but is improving. She's never had this before with Flovent use. She also states her mouth and nose are more dry than they usually are after using Flovent. She has a history of asthma but normally does not have chest tightness like this. She also has a history of acid reflux, osteoarthritis and cataracts. She was supposed to start a new nasal decongestant medication but did not start this because she was worried it could cause cataracts. She states her vision is the same as always. There is no blurry vision or spots in her vision or visual loss. There is no eye pain or headache. She denies any shortness of breath currently. She denies any leg pain or leg swelling. She denies any abdominal pain nausea or vomiting. She denies any cardiac history does not think she's ever had a stress test.   The history is provided by the patient.  Chest Pain   Associated symptoms include cough and shortness of breath. Pertinent negatives include no abdominal pain, no dizziness, no fever, no headaches, no nausea, no vomiting and no weakness.    Past Medical History:  Diagnosis Date  . Allergic arthritis   . Asthma   . Cataract    bilateral  . GERD (gastroesophageal reflux disease)   . Osteoarthritis     Patient Active Problem List   Diagnosis Date Noted  . Cataracts, bilateral 07/10/2014  . Asthma, chronic 07/10/2014  . GERD (gastroesophageal reflux disease) 07/10/2014  . Palpitations 07/10/2014  . Tachycardia 07/10/2014    Past Surgical History:  Procedure Laterality Date  . ABDOMINAL HYSTERECTOMY    . BREAST SURGERY     2 biopsy  .  BRONCHOSCOPY    . NASAL SINUS SURGERY      OB History    No data available       Home Medications    Prior to Admission medications   Medication Sig Start Date End Date Taking? Authorizing Provider  albuterol (PROVENTIL HFA;VENTOLIN HFA) 108 (90 BASE) MCG/ACT inhaler Inhale 2 puffs into the lungs every 6 (six) hours as needed for wheezing or shortness of breath. 01/08/15  Yes Jade L Breeback, PA-C  fluticasone (FLOVENT HFA) 110 MCG/ACT inhaler Inhale 2 puffs into the lungs 2 (two) times daily. 06/30/15  Yes Jade L Breeback, PA-C  Lactobacillus (ACIDOPHILUS) 100 MG CAPS Take by mouth.   Yes Historical Provider, MD  mometasone (NASONEX) 50 MCG/ACT nasal spray Place 2 sprays into the nose daily. 03/01/16  Yes Jade L Breeback, PA-C  azelastine (ASTELIN) 0.1 % nasal spray Place 2 sprays into both nostrils 2 (two) times daily. Use in each nostril as directed 12/09/15   Donella Stade, PA-C  triamcinolone (NASACORT ALLERGY 24HR) 55 MCG/ACT AERO nasal inhaler Place 1 spray into the nose as needed.    Historical Provider, MD    Family History Family History  Problem Relation Age of Onset  . Alzheimer's disease Mother   . Osteoarthritis Mother   . Cancer Mother     colon  . Cancer Father     colon  . Multiple sclerosis Brother   .  Myasthenia gravis Brother   . Myasthenia gravis Brother     Social History Social History  Substance Use Topics  . Smoking status: Never Smoker  . Smokeless tobacco: Never Used  . Alcohol use No     Allergies   Aspirin; Atrovent [ipratropium]; Qnasl [beclomethasone]; and Zithromax [azithromycin]   Review of Systems Review of Systems  Constitutional: Negative for activity change, appetite change and fever.  HENT: Positive for congestion, postnasal drip and rhinorrhea.   Eyes: Negative for photophobia and visual disturbance.  Respiratory: Positive for cough, chest tightness and shortness of breath.   Cardiovascular: Positive for chest pain.    Gastrointestinal: Negative for abdominal pain, nausea and vomiting.  Genitourinary: Negative for dysuria, hematuria, vaginal bleeding and vaginal discharge.  Musculoskeletal: Negative for arthralgias and myalgias.  Skin: Negative for rash.  Neurological: Negative for dizziness, weakness, light-headedness and headaches.  Psychiatric/Behavioral: Negative for suicidal ideas.  A complete 10 system review of systems was obtained and all systems are negative except as noted in the HPI and PMH.     Physical Exam Updated Vital Signs BP 146/82 (BP Location: Right Arm)   Pulse 108   Temp 98.3 F (36.8 C) (Oral)   Resp 18   Ht '5\' 2"'$  (1.575 m)   Wt 118 lb (53.5 kg)   SpO2 96%   BMI 21.58 kg/m   Physical Exam  Constitutional: She is oriented to person, place, and time. She appears well-developed and well-nourished. No distress.  Anxious appearing  HENT:  Head: Normocephalic and atraumatic.  Mouth/Throat: Oropharynx is clear and moist. No oropharyngeal exudate.  Eyes: Conjunctivae and EOM are normal. Pupils are equal, round, and reactive to light.  Neck: Normal range of motion. Neck supple.  No meningismus.  Cardiovascular: Normal rate, regular rhythm, normal heart sounds and intact distal pulses.  Exam reveals no gallop.   No murmur heard. Pulmonary/Chest: Effort normal and breath sounds normal. No respiratory distress. She has no wheezes. She exhibits no tenderness.  Abdominal: Soft. There is no tenderness. There is no rebound and no guarding.  Musculoskeletal: Normal range of motion. She exhibits no edema or tenderness.  Neurological: She is alert and oriented to person, place, and time. No cranial nerve deficit. She exhibits normal muscle tone. Coordination normal.  No ataxia on finger to nose bilaterally. No pronator drift. 5/5 strength throughout. CN 2-12 intact.Equal grip strength. Sensation intact.   Skin: Skin is warm.  Psychiatric: She has a normal mood and affect. Her behavior  is normal.  Nursing note and vitals reviewed.    ED Treatments / Results  Labs (all labs ordered are listed, but only abnormal results are displayed) Labs Reviewed  COMPREHENSIVE METABOLIC PANEL - Abnormal; Notable for the following:       Result Value   ALT 12 (*)    Total Bilirubin 1.7 (*)    All other components within normal limits  CBC WITH DIFFERENTIAL/PLATELET  LIPASE, BLOOD  URINALYSIS, ROUTINE W REFLEX MICROSCOPIC (NOT AT [facility])  TROPONIN I    EKG  EKG Interpretation  Date/Time:  Sunday June 06 2016 11:01:03 EDT Ventricular Rate:  92 PR Interval:  134 QRS Duration: 74 QT Interval:  352 QTC Calculation: 435 R Axis:   69 Text Interpretation:  Normal sinus rhythm Normal ECG No previous ECGs available Confirmed by Wyvonnia Dusky  MD, Tevin Shillingford ([id number]) on 06/06/2016 11:29:00 AM       Radiology Dg Chest 2 View  Result Date: 06/06/2016 CLINICAL DATA:  Chest pain.  Chronic asthma.  On inhalers. EXAM: CHEST  2 VIEW COMPARISON:  None. FINDINGS: Midline trachea. Patient rotated left. Normal heart size. Atherosclerosis in the transverse aorta. No pleural effusion or pneumothorax. Bilateral pleural thickening. Left worse than right apical opacities with architectural distortion and hilar retraction superiorly. Mild scarring at the left lung base. IMPRESSION: Left worse than right pleural parenchymal opacities with architectural distortion and hilar retraction. Most likely related to scarring, possibly from prior atypical bacterial infection (including mycobacterium). An acute infectious process is impossible to exclude, given absence of comparisons. If prior radiographs are available, the should be reviewed. If not, radiographic follow-up at 3-5 days should be considered. Atherosclerosis in the transverse aorta. Electronically Signed   By: Abigail Miyamoto M.D.   On: 06/06/2016 13:15    Procedures Procedures (including critical care time)  Medications Ordered in ED Medications - No data  to display   Initial Impression / Assessment and Plan / ED Course  I have reviewed the triage vital signs and the nursing notes.  Pertinent labs & imaging results that were available during my care of the patient were reviewed by me and considered in my medical decision making (see chart for details).  Clinical Course   Chest tightness after using Flovent inhaler. It s gradually improving. EKG shows normal sinus rhythm. Low suspicion for ACS we'll obtain chest x-ray and labs based on  patient's age. Doubt PE. No hypoxic or tachycardia. No pleuritic or exertional pain.  EKG is normal sinus rhythm. Chest x-ray shows apical scarring which there is no comparison for the patient states that she has been told this in the past. There is no wheezing on exam. Will cover with doxycycline.  Patient informed of abnormal CXR.  Troponin negative. We'll obtain second troponin both suspicion for ACS is low. Patient's chest tightness has resolved.  HEART score 1. Pain atypical for ACS.  Doubt PE, doubt aortic dissection.  Dr. Audie Pinto to disposition after second troponin.  Final Clinical Impressions(s) / ED Diagnoses   Final diagnoses:  Chest pain, unspecified chest pain type    New Prescriptions New Prescriptions   No medications on file     Ezequiel Essex, MD 06/06/16 1646

## 2016-06-06 NOTE — ED Notes (Signed)
Pt on auto VS and cardiac monitor. 

## 2016-06-06 NOTE — ED Notes (Signed)
Pt ambulated to restroom with no assistance, just for precaution I walked with her. Pt then placed back on auto VS and cardiac monitor.

## 2016-06-09 ENCOUNTER — Encounter: Payer: Self-pay | Admitting: Physician Assistant

## 2016-06-09 ENCOUNTER — Ambulatory Visit (INDEPENDENT_AMBULATORY_CARE_PROVIDER_SITE_OTHER): Payer: Medicare Other | Admitting: Physician Assistant

## 2016-06-09 VITALS — BP 115/53 | HR 92 | Ht 62.0 in | Wt 113.0 lb

## 2016-06-09 DIAGNOSIS — R0981 Nasal congestion: Secondary | ICD-10-CM | POA: Diagnosis not present

## 2016-06-09 DIAGNOSIS — R4189 Other symptoms and signs involving cognitive functions and awareness: Secondary | ICD-10-CM | POA: Diagnosis not present

## 2016-06-09 DIAGNOSIS — J4520 Mild intermittent asthma, uncomplicated: Secondary | ICD-10-CM

## 2016-06-09 DIAGNOSIS — R0789 Other chest pain: Secondary | ICD-10-CM | POA: Diagnosis not present

## 2016-06-09 MED ORDER — FLUTICASONE PROPIONATE HFA 110 MCG/ACT IN AERO: 2.0000 | INHALATION_SPRAY | Freq: Two times a day (BID) | RESPIRATORY_TRACT | 5 refills | Status: DC

## 2016-06-09 MED ORDER — ALBUTEROL SULFATE HFA 108 (90 BASE) MCG/ACT IN AERS: 2.0000 | INHALATION_SPRAY | Freq: Four times a day (QID) | RESPIRATORY_TRACT | 2 refills | Status: DC | PRN

## 2016-06-09 NOTE — Progress Notes (Signed)
   Subjective:    Patient ID: Brandy Wright, female    DOB: January 01, 1933, 82 y.o.   MRN: [id number]  HPI Patient is a 82 year old female who presents to the clinic to follow-up after hospital visit for chest tightness on 06/06/2016.  Hospital visit summarize below. Chest tightness after using Flovent inhaler. It s gradually improving. EKG shows normal sinus rhythm. Low suspicion for ACS we'll obtain chest x-ray and labs based on  patient's age. Doubt PE. No hypoxic or tachycardia. No pleuritic or exertional pain.  EKG is normal sinus rhythm. Chest x-ray shows apical scarring which there is no comparison for the patient states that she has been told this in the past. There is no wheezing on exam. Will cover with doxycycline.  Patient informed of abnormal CXR.  Troponin negative. We'll obtain second troponin both suspicion for ACS is low. Patient's chest tightness has resolved.  HEART score 1. Pain atypical for ACS.  Doubt PE, doubt aortic dissection.  Dr. Audie Pinto to disposition after second troponin.  Patient reports symptoms have resolved. Daughter is with patient today. She feels like mother is having more paranoid symptoms. She is worried about everything. she forgetting things. She believes that people are out of get her or break into her house. She has felt more congested nasally. She has a history of allergies and asthma. She is concerned about taking Nasonex due to its risk for cataracts.   Review of Systems    see HPI.  Objective:   Physical Exam  Constitutional: She is oriented to person, place, and time. She appears well-developed and well-nourished.  HENT:  Head: Normocephalic and atraumatic.  Right Ear: External ear normal.  Left Ear: External ear normal.  Mouth/Throat: Oropharynx is clear and moist. No oropharyngeal exudate.  TM"s clear bilaterally.  Bilateral red and swollen nasal turbinates.  No tenderness over maxillary sinuses to palpation.   Eyes: Conjunctivae are  normal. Right eye exhibits no discharge. Left eye exhibits no discharge.  Neck: Normal range of motion. Neck supple.  Cardiovascular: Normal rate, regular rhythm and normal heart sounds.   Pulmonary/Chest: Effort normal and breath sounds normal. She has no wheezes.  Lymphadenopathy:    She has no cervical adenopathy.  Neurological: She is alert and oriented to person, place, and time.  Psychiatric: She has a normal mood and affect. Her behavior is normal.          Assessment & Plan:  Chest tightness/pain- resolved since ED. ressured ED work up was normal. Suspect this could be more paranoid behavior then symptoms. Symptoms could have represented more panic if flovent was inhaled improperly. Follow up if symptoms come back.    Asthma, mild intermittent- controlled refilled flovent.   Nasal congestion- reassured pt of nasonex and safety. Encouraged zyrtec at bedtime. Follow up as needed.   Cognitive changes- 6CIT 14/28. Discussed this score is significant. Pt does not want to proceed with any further work up. Discussed medications can improve cognitive function. Pt declined dementia panel work up.   Spent 30 minutes with patient and greater than 50 percent of visit spent counseling patient regarding symptoms.

## 2016-06-11 DIAGNOSIS — R0981 Nasal congestion: Secondary | ICD-10-CM | POA: Insufficient documentation

## 2020-04-26 ENCOUNTER — Emergency Department (HOSPITAL_COMMUNITY): Payer: Medicare Other

## 2020-04-26 ENCOUNTER — Emergency Department (HOSPITAL_COMMUNITY)
Admission: EM | Admit: 2020-04-26 | Discharge: 2020-04-26 | Disposition: A | Discharge status: Home / Self Care | Payer: Medicare Other | Attending: Emergency Medicine | Admitting: Emergency Medicine

## 2020-04-26 ENCOUNTER — Encounter (HOSPITAL_COMMUNITY): Payer: Self-pay

## 2020-04-26 DIAGNOSIS — R0600 Dyspnea, unspecified: Secondary | ICD-10-CM | POA: Insufficient documentation

## 2020-04-26 DIAGNOSIS — R062 Wheezing: Secondary | ICD-10-CM | POA: Diagnosis present

## 2020-04-26 DIAGNOSIS — J45909 Unspecified asthma, uncomplicated: Secondary | ICD-10-CM | POA: Diagnosis not present

## 2020-04-26 DIAGNOSIS — J441 Chronic obstructive pulmonary disease with (acute) exacerbation: Secondary | ICD-10-CM | POA: Insufficient documentation

## 2020-04-26 DIAGNOSIS — Z7951 Long term (current) use of inhaled steroids: Secondary | ICD-10-CM | POA: Insufficient documentation

## 2020-04-26 LAB — COMPREHENSIVE METABOLIC PANEL
ALT: 15 U/L (ref 0–44)
AST: 34 U/L (ref 15–41)
Albumin: 3.3 g/dL — ABNORMAL LOW (ref 3.5–5.0)
Alkaline Phosphatase: 72 U/L (ref 38–126)
Anion gap: 9 (ref 5–15)
BUN: 17 mg/dL (ref 8–23)
CO2: 27 mmol/L (ref 22–32)
Calcium: 8.7 mg/dL — ABNORMAL LOW (ref 8.9–10.3)
Chloride: 104 mmol/L (ref 98–111)
Creatinine, Ser: 0.71 mg/dL (ref 0.44–1.00)
GFR calc Af Amer: >60 mL/min (ref >60)
GFR calc non Af Amer: >60 mL/min (ref >60)
Glucose, Bld: 95 mg/dL (ref 70–99)
Potassium: 3.9 mmol/L (ref 3.5–5.1)
Sodium: 140 mmol/L (ref 135–145)
Total Bilirubin: 1.2 mg/dL (ref 0.3–1.2)
Total Protein: 7.1 g/dL (ref 6.5–8.1)

## 2020-04-26 LAB — CBC WITH DIFFERENTIAL/PLATELET
Abs Immature Granulocytes: 0.03 10*3/uL (ref 0.00–0.07)
Basophils Absolute: 0.1 10*3/uL (ref 0.0–0.1)
Basophils Relative: 1 %
Eosinophils Absolute: 0.5 10*3/uL (ref 0.0–0.5)
Eosinophils Relative: 6 %
HCT: 39.9 % (ref 36.0–46.0)
Hemoglobin: 12.5 g/dL (ref 12.0–15.0)
Immature Granulocytes: 0 %
Lymphocytes Relative: 14 %
Lymphs Abs: 1.2 10*3/uL (ref 0.7–4.0)
MCH: 27 pg (ref 26.0–34.0)
MCHC: 31.3 g/dL (ref 30.0–36.0)
MCV: 86.2 fL (ref 80.0–100.0)
Monocytes Absolute: 1.1 10*3/uL — ABNORMAL HIGH (ref 0.1–1.0)
Monocytes Relative: 13 %
Neutro Abs: 5.5 10*3/uL (ref 1.7–7.7)
Neutrophils Relative %: 66 %
Platelets: 239 10*3/uL (ref 150–400)
RBC: 4.63 MIL/uL (ref 3.87–5.11)
RDW: 15.5 % (ref 11.5–15.5)
WBC: 8.3 10*3/uL (ref 4.0–10.5)
nRBC: 0 % (ref 0.0–0.2)

## 2020-04-26 LAB — BRAIN NATRIURETIC PEPTIDE: B Natriuretic Peptide: 71.6 pg/mL (ref 0.0–100.0)

## 2020-04-26 MED ORDER — PREDNISONE 20 MG PO TABS
40.0000 mg | ORAL_TABLET | Freq: Every day | ORAL | 0 refills | Status: DC
Start: 2020-04-26 — End: 2020-08-21

## 2020-04-26 MED ORDER — PREDNISONE 20 MG PO TABS
40.0000 mg | ORAL_TABLET | Freq: Every day | ORAL | 0 refills | Status: DC
Start: 2020-04-26 — End: 2020-04-26

## 2020-04-26 NOTE — ED Notes (Signed)
Pts spo2 trending between 84-86% on room air. Pt placed on 1 liter nasal cannula sats now between 94-96%. Will continue to monitor

## 2020-04-26 NOTE — ED Triage Notes (Signed)
Pt presents via EMS from [facility]. Staff reported pt was altered but pt does have dementia at baseline. Per EMS, pt is acting appropriately and able to tell us who she is. Pt had some expiratory wheezing per EMS, neb tx given en route as well as 125 solumedrol IV. Pt is not c/o any pain. Pt reports she feels like she is breathing ok, hx of COPD.

## 2020-04-26 NOTE — ED Notes (Signed)
Pts daughter states she will be taking pt back home to her facility

## 2020-04-26 NOTE — ED Provider Notes (Signed)
[facility] DEPT Provider Note   CSN: [id number] Arrival date & time: 04/26/20  1357     History Chief Complaint  Patient presents with  . Shortness of Breath    Brandy Wright is a 86 y.o. female.  HPI    Patient presents from nursing facility with staff concern of dyspnea, wheezing. Patient herself questions why she is here being evaluated.  Semi-, the patient was observed to have wheezing, increased work of breathing at her nursing facility. She has a known history of COPD, uses bronchodilators regularly. After she was provided albuterol, she reportedly had improvement in her wheezing, per EMS. EMS does not report hypoxia, respiratory distress. Patient was received Solu-Medrol in route. The patient self currently denies pain, denies dyspnea, denies weakness, denies weight gain. Past Medical History:  Diagnosis Date  . Allergic arthritis   . Asthma   . Cataract    bilateral  . GERD (gastroesophageal reflux disease)   . Osteoarthritis     Patient Active Problem List   Diagnosis Date Noted  . Nasal congestion 06/11/2016  . Asthma, mild intermittent 06/09/2016  . Chest tightness 06/09/2016  . Cognitive changes 06/09/2016  . Cataracts, bilateral 07/10/2014  . Asthma, chronic 07/10/2014  . GERD (gastroesophageal reflux disease) 07/10/2014  . Palpitations 07/10/2014  . Tachycardia 07/10/2014    Past Surgical History:  Procedure Laterality Date  . ABDOMINAL HYSTERECTOMY    . BREAST SURGERY     2 biopsy  . BRONCHOSCOPY    . NASAL SINUS SURGERY       OB History   No obstetric history on file.     Family History  Problem Relation Age of Onset  . Alzheimer's disease Mother   . Osteoarthritis Mother   . Cancer Mother        colon  . Cancer Father        colon  . Multiple sclerosis Brother   . Myasthenia gravis Brother   . Myasthenia gravis Brother     Social History   Tobacco Use  . Smoking status: Never Smoker  .  Smokeless tobacco: Never Used  Substance Use Topics  . Alcohol use: No  . Drug use: No    Home Medications Prior to Admission medications   Medication Sig Start Date End Date Taking? Authorizing Provider  albuterol (PROVENTIL HFA;VENTOLIN HFA) 108 (90 Base) MCG/ACT inhaler Inhale 2 puffs into the lungs every 6 (six) hours as needed for wheezing or shortness of breath. 06/09/16  Yes Breeback, Jade L, PA-C  atorvastatin (LIPITOR) 10 MG tablet Take 10 mg by mouth at bedtime.   Yes [provider]  cetirizine (ZYRTEC) 10 MG tablet Take 10 mg by mouth daily.   Yes [provider]  Cholecalciferol (VITAMIN D-3) 25 MCG (1000 UT) CAPS Take 1,000 Units by mouth daily.   Yes [provider]  fluticasone (FLOVENT HFA) 220 MCG/ACT inhaler Inhale 2 puffs into the lungs 2 (two) times daily.   Yes [provider]  melatonin 3 MG TABS tablet Take 3 mg by mouth at bedtime.   Yes [provider]  OLANZapine (ZYPREXA) 7.5 MG tablet Take 7.5 mg by mouth at bedtime.   Yes [provider]  sennosides-docusate sodium (SENOKOT-S) 8.6-50 MG tablet Take 2 tablets by mouth in the morning and at bedtime.   Yes [provider]  sertraline (ZOLOFT) 100 MG tablet Take 100 mg by mouth daily.   Yes [provider]  azelastine (ASTELIN) 0.1 %  nasal spray Place 2 sprays into both nostrils 2 (two) times daily. Use in each nostril as directed Patient not taking: Reported on 04/26/2020 12/09/15   Iran Planas L, PA-C  fluticasone (FLOVENT HFA) 110 MCG/ACT inhaler Inhale 2 puffs into the lungs 2 (two) times daily. Patient not taking: Reported on 04/26/2020 06/09/16   Iran Planas L, PA-C  mometasone (NASONEX) 50 MCG/ACT nasal spray Place 2 sprays into the nose daily. Patient not taking: Reported on 04/26/2020 03/01/16   Donella Stade, PA-C  predniSONE (DELTASONE) 20 MG tablet Take 2 tablets (40 mg total) by mouth daily with breakfast. For the next four days  04/26/20   Carmin Muskrat, MD    Allergies    Cephalexin, Penicillins, Sulfamethoxazole-trimethoprim, Aspirin, Atrovent [ipratropium], Qnasl [beclomethasone], Zithromax [azithromycin], Ciprofloxacin, and Pseudoephedrine hcl  Review of Systems   Review of Systems  Constitutional:       Per HPI, otherwise negative  HENT:       Per HPI, otherwise negative  Eyes:       Cataracts  Respiratory:       Per HPI, otherwise negative  Cardiovascular:       Per HPI, otherwise negative  Gastrointestinal: Negative for vomiting.  Endocrine:       Negative aside from HPI  Genitourinary:       Neg aside from HPI   Musculoskeletal:       Per HPI, otherwise negative  Skin: Negative.   Neurological: Negative for syncope.    Physical Exam Updated Vital Signs BP (!) 111/51   Pulse 72   Temp (!) 97.5 F (36.4 C) (Oral)   Resp 19   SpO2 95%   Physical Exam Vitals and nursing note reviewed.  Constitutional:      General: She is not in acute distress.    Appearance: She is well-developed. She is ill-appearing. She is not toxic-appearing or diaphoretic.  HENT:     Head: Normocephalic and atraumatic.  Eyes:     Conjunctiva/sclera: Conjunctivae normal.  Cardiovascular:     Rate and Rhythm: Normal rate and regular rhythm.  Pulmonary:     Effort: Pulmonary effort is normal. No respiratory distress.     Breath sounds: No stridor. Decreased breath sounds present. No wheezing.  Abdominal:     General: There is no distension.  Skin:    General: Skin is warm and dry.  Neurological:     Mental Status: She is alert and oriented to person, place, and time.     Cranial Nerves: No cranial nerve deficit.     ED Results / Procedures / Treatments   Labs (all labs ordered are listed, but only abnormal results are displayed) Labs Reviewed  COMPREHENSIVE METABOLIC PANEL - Abnormal; Notable for the following components:      Result Value   Calcium 8.7 (*)    Albumin 3.3 (*)    All other  components within normal limits  CBC WITH DIFFERENTIAL/PLATELET - Abnormal; Notable for the following components:   Monocytes Absolute 1.1 (*)    All other components within normal limits  BRAIN NATRIURETIC PEPTIDE    EKG EKG Interpretation  Date/Time:  Saturday April 26 2020 14:22:10 EDT Ventricular Rate:  100 PR Interval:    QRS Duration: 85 QT Interval:  334 QTC Calculation: 431 R Axis:   77 Text Interpretation: Sinus tachycardia Borderline repolarization abnormality Artifact Baseline wander Abnormal ECG Confirmed by Carmin Muskrat [phone]) on 04/26/2020 8:12:20 PM   Radiology DG Chest [facility] [address]  Result Date: 04/26/2020 CLINICAL DATA:  Shortness of breath EXAM: PORTABLE CHEST 1 VIEW COMPARISON:  October 15, 2019 chest radiograph and chest CT. FINDINGS: There is shift of heart and mediastinum toward the left, stable. There is apparent abrupt narrowing of the left upper lobe bronchus with soft tissue fullness in this area, similar to recent CT appearance. There is scarring with volume loss in the left upper lobe. There is also apparent scarring in the left mid lung. There is ill-defined airspace opacity in the left base consistent with scarring in atelectasis. Right lung is hyperexpanded but clear. Heart size is normal. Pulmonary vascularity is grossly normal. No bone lesions. IMPRESSION: Volume loss on the left with areas of scarring. Soft tissue fullness in the left perihilar region with fairly abrupt tapering of the left upper lobe bronchus appears grossly stable. Underlying neoplasm in this area must be of concern. If this area has not been assessed previously by bronchoscopy, Pulmonary Medicine consultation with consideration for bronchoscopy may well be warranted. No new opacity evident. Right lung hyperexpanded and clear. Stable cardiac silhouette. Electronically Signed   By: Lowella Grip III M.D.   On: 04/26/2020 15:07    Procedures Procedures (including critical care time)   Medications Ordered in ED Medications - No data to display  ED Course  I have reviewed the triage vital signs and the nursing notes.  Pertinent labs & imaging results that were available during my care of the patient were reviewed by me and considered in my medical decision making (see chart for details).   8:37 PM Patient accompanied by her daughter. Daughter notes the patient appears better, calm. The patient herself sleeping. Daughter confirmed that the patient did have an episode of dyspnea, with slight bluish nose and her lips prior to transferring from her nursing facility.  The patient also noted to have history of scarring in her lungs, has had multiple evaluations in the past, as well as after mentioned COPD. We discussed today's findings, including generally consistent x-ray with prior studies, no evidence for pneumonia, improvement here following initiation of therapy, both with EMS and here for COPD exacerbation, absence of ongoing respiratory distress, absence of substantial lab abnormalities. Daughter amenable to not waiting for urinalysis a longer, discharged with ongoing bronchodilators, steroids, outpatient follow-up.   MDM Number of Diagnoses or Management Options COPD exacerbation ([place]): established, worsening   Amount and/or Complexity of Data Reviewed Clinical lab tests: reviewed Tests in the medicine section of CPT: reviewed Decide to obtain previous medical records or to obtain history from someone other than the patient: yes Obtain history from someone other than the patient: yes Review and summarize past medical records: yes Independent visualization of images, tracings, or specimens: yes  Risk of Complications, Morbidity, and/or Mortality Presenting problems: high Diagnostic procedures: high Management options: high  Critical Care Total time providing critical care: < 30 minutes  Patient Progress Patient progress: improved     Final Clinical  Impression(s) / ED Diagnoses Final diagnoses:  COPD exacerbation ([place])    Rx / DC Orders ED Discharge Orders         Ordered    predniSONE (DELTASONE) 20 MG tablet  Daily with breakfast     Discontinue  Reprint     04/26/20 2036           Carmin Muskrat, MD 04/26/20 2040

## 2020-04-26 NOTE — Discharge Instructions (Addendum)
As discussed, your evaluation today has been largely reassuring.  But, it is important that you monitor your condition carefully, and do not hesitate to return to the ED if you develop new, or concerning changes in your condition.  In addition to the prescribed steroids, please use your albuterol every 4 hours for the next 2 days.  You may then resume normal usage patterns.  Otherwise, please follow-up with your physician for appropriate ongoing care.

## 2020-04-26 NOTE — ED Notes (Signed)
Pt still unable to urinate via pure wic.

## 2020-08-21 ENCOUNTER — Emergency Department (HOSPITAL_COMMUNITY): Payer: Medicare Other

## 2020-08-21 ENCOUNTER — Emergency Department (HOSPITAL_COMMUNITY)
Admission: EM | Admit: 2020-08-21 | Discharge: 2020-08-21 | Disposition: A | Discharge status: Home / Self Care | Payer: Medicare Other | Attending: Emergency Medicine | Admitting: Emergency Medicine

## 2020-08-21 DIAGNOSIS — W19XXXA Unspecified fall, initial encounter: Secondary | ICD-10-CM

## 2020-08-21 DIAGNOSIS — S0181XA Laceration without foreign body of other part of head, initial encounter: Secondary | ICD-10-CM | POA: Insufficient documentation

## 2020-08-21 DIAGNOSIS — Z7951 Long term (current) use of inhaled steroids: Secondary | ICD-10-CM | POA: Insufficient documentation

## 2020-08-21 DIAGNOSIS — W1839XA Other fall on same level, initial encounter: Secondary | ICD-10-CM | POA: Insufficient documentation

## 2020-08-21 DIAGNOSIS — S0990XA Unspecified injury of head, initial encounter: Secondary | ICD-10-CM | POA: Diagnosis present

## 2020-08-21 DIAGNOSIS — J45909 Unspecified asthma, uncomplicated: Secondary | ICD-10-CM | POA: Insufficient documentation

## 2020-08-21 MED ORDER — LIDOCAINE-EPINEPHRINE-TETRACAINE (LET) TOPICAL GEL
3.0000 mL | Freq: Once | TOPICAL | Status: AC
  Administered 2020-08-21: 3 mL via TOPICAL
  Filled 2020-08-21: qty 3

## 2020-08-21 NOTE — ED Triage Notes (Signed)
Pt presents from [facility] s/p unwitnessed [place]. Pt states they remember falling this AM but are unsure of the impetus. -ve blood thinner. Pt endorsing mild HA, no neuro deficit.   Pt alert and oriented to time and person, which is baseline per EMS.

## 2020-08-21 NOTE — ED Notes (Signed)
Pt resting in bed. NADN 

## 2020-08-21 NOTE — ED Provider Notes (Signed)
[facility] Provider Note   CSN: [id number] Arrival date & time: 08/21/20  0750     History Chief Complaint  Patient presents with  . Fall    Brandy Wright is a 86 y.o. female.  The history is provided by the patient, medical records and the EMS personnel (ems report to nursing). No language interpreter was used.  Head Injury Location:  Frontal Mechanism of injury: fall   Fall:    Height of fall:  Standing Pain details:    Quality:  Aching   Severity:  Mild   Progression:  Unchanged Chronicity:  Recurrent Relieved by:  Nothing Worsened by:  Nothing Ineffective treatments:  None tried Associated symptoms: headache and neck pain   Associated symptoms: no blurred vision, no difficulty breathing, no double vision, no focal weakness, no hearing loss, no loss of consciousness, no memory loss, no nausea, no numbness, no seizures and no vomiting   Risk factors: being elderly        Past Medical History:  Diagnosis Date  . Allergic arthritis   . Asthma   . Cataract    bilateral  . GERD (gastroesophageal reflux disease)   . Osteoarthritis     Patient Active Problem List   Diagnosis Date Noted  . Nasal congestion 06/11/2016  . Asthma, mild intermittent 06/09/2016  . Chest tightness 06/09/2016  . Cognitive changes 06/09/2016  . Cataracts, bilateral 07/10/2014  . Asthma, chronic 07/10/2014  . GERD (gastroesophageal reflux disease) 07/10/2014  . Palpitations 07/10/2014  . Tachycardia 07/10/2014    Past Surgical History:  Procedure Laterality Date  . ABDOMINAL HYSTERECTOMY    . BREAST SURGERY     2 biopsy  . BRONCHOSCOPY    . NASAL SINUS SURGERY       OB History   No obstetric history on file.     Family History  Problem Relation Age of Onset  . Alzheimer's disease Mother   . Osteoarthritis Mother   . Cancer Mother        colon  . Cancer Father        colon  . Multiple sclerosis Brother   . Myasthenia gravis  Brother   . Myasthenia gravis Brother     Social History   Tobacco Use  . Smoking status: Never Smoker  . Smokeless tobacco: Never Used  Substance Use Topics  . Alcohol use: No  . Drug use: No    Home Medications Prior to Admission medications   Medication Sig Start Date End Date Taking? Authorizing Provider  albuterol (PROVENTIL HFA;VENTOLIN HFA) 108 (90 Base) MCG/ACT inhaler Inhale 2 puffs into the lungs every 6 (six) hours as needed for wheezing or shortness of breath. 06/09/16   Breeback, Jade L, PA-C  atorvastatin (LIPITOR) 10 MG tablet Take 10 mg by mouth at bedtime.    [provider]  azelastine (ASTELIN) 0.1 % nasal spray Place 2 sprays into both nostrils 2 (two) times daily. Use in each nostril as directed Patient not taking: Reported on 04/26/2020 12/09/15   Donella Stade, PA-C  cetirizine (ZYRTEC) 10 MG tablet Take 10 mg by mouth daily.    [provider]  Cholecalciferol (VITAMIN D-3) 25 MCG (1000 UT) CAPS Take 1,000 Units by mouth daily.    [provider]  fluticasone (FLOVENT HFA) 110 MCG/ACT inhaler Inhale 2 puffs into the lungs 2 (two) times daily. Patient not taking: Reported on 04/26/2020 06/09/16   Iran Planas L, PA-C  fluticasone (FLOVENT  HFA) 220 MCG/ACT inhaler Inhale 2 puffs into the lungs 2 (two) times daily.    [provider]  melatonin 3 MG TABS tablet Take 3 mg by mouth at bedtime.    [provider]  mometasone (NASONEX) 50 MCG/ACT nasal spray Place 2 sprays into the nose daily. Patient not taking: Reported on 04/26/2020 03/01/16   Iran Planas L, PA-C  OLANZapine (ZYPREXA) 7.5 MG tablet Take 7.5 mg by mouth at bedtime.    [provider]  predniSONE (DELTASONE) 20 MG tablet Take 2 tablets (40 mg total) by mouth daily with breakfast. For the next four days 04/26/20   Carmin Muskrat, MD  sennosides-docusate sodium (SENOKOT-S) 8.6-50 MG tablet Take 2 tablets by mouth in the morning and at bedtime.     [provider]  sertraline (ZOLOFT) 100 MG tablet Take 100 mg by mouth daily.    [provider]    Allergies    Cephalexin, Penicillins, Sulfamethoxazole-trimethoprim, Aspirin, Atrovent [ipratropium], Qnasl [beclomethasone], Zithromax [azithromycin], Ciprofloxacin, and Pseudoephedrine hcl  Review of Systems   Review of Systems  Constitutional: Negative for chills, diaphoresis, fatigue and fever.  HENT: Negative for congestion and hearing loss.   Eyes: Negative for blurred vision, double vision, photophobia and visual disturbance.  Respiratory: Negative for cough, chest tightness, shortness of breath and wheezing.   Cardiovascular: Negative for chest pain.  Gastrointestinal: Negative for abdominal pain, constipation, diarrhea, nausea and vomiting.  Genitourinary: Negative for dysuria.  Musculoskeletal: Positive for neck pain. Negative for back pain and neck stiffness.  Skin: Positive for wound (abrasion to forenead).  Neurological: Positive for headaches. Negative for dizziness, focal weakness, seizures, loss of consciousness, weakness, light-headedness and numbness.  Psychiatric/Behavioral: Negative for agitation, confusion and memory loss.  All other systems reviewed and are negative.   Physical Exam Updated Vital Signs BP 132/67 (BP Location: Left Arm)   Pulse 77   Temp 98.1 F (36.7 C) (Oral)   Resp 18   SpO2 95%   Physical Exam Vitals and nursing note reviewed.  Constitutional:      General: She is not in acute distress.    Appearance: She is well-developed. She is not ill-appearing, toxic-appearing or diaphoretic.  HENT:     Head: Abrasion and contusion present.      Comments: No focal neurologic deficits on head or neck.  Normal sensation and strength in face.  Clear speech.  Symmetric smile.  Pupils are symmetric reactive normal extraocular movements.  Small bump on forehead.  No raccoon eyes or battle sign.    Nose: Nose normal.     Mouth/Throat:       Mouth: Mucous membranes are moist.     Pharynx: No oropharyngeal exudate or posterior oropharyngeal erythema.  Eyes:     Extraocular Movements: Extraocular movements intact.     Conjunctiva/sclera: Conjunctivae normal.     Pupils: Pupils are equal, round, and reactive to light.  Cardiovascular:     Rate and Rhythm: Normal rate and regular rhythm.     Heart sounds: No murmur heard.   Pulmonary:     Effort: Pulmonary effort is normal. No respiratory distress.     Breath sounds: Normal breath sounds. No wheezing, rhonchi or rales.  Chest:     Chest wall: No tenderness.  Abdominal:     General: Abdomen is flat.     Palpations: Abdomen is soft.     Tenderness: There is no abdominal tenderness. There is no right CVA tenderness, left CVA tenderness, guarding  or rebound.  Musculoskeletal:        General: Tenderness present.     Cervical back: Neck supple. No tenderness.  Skin:    General: Skin is warm and dry.     Findings: No erythema.  Neurological:     General: No focal deficit present.     Mental Status: She is alert.     Sensory: No sensory deficit.     Motor: No weakness.  Psychiatric:        Mood and Affect: Mood normal.     ED Results / Procedures / Treatments   Labs (all labs ordered are listed, but only abnormal results are displayed) Labs Reviewed - No data to display  EKG None  Radiology CT Head Wo Contrast  Result Date: 08/21/2020 CLINICAL DATA:  Fall. EXAM: CT HEAD WITHOUT CONTRAST CT CERVICAL SPINE WITHOUT CONTRAST TECHNIQUE: Multidetector CT imaging of the head and cervical spine was performed following the standard protocol without intravenous contrast. Multiplanar CT image reconstructions of the cervical spine were also generated. COMPARISON:  CT chest 10/15/2019. FINDINGS: CT HEAD FINDINGS Brain: No evidence of acute infarction, hemorrhage, hydrocephalus, extra-axial collection or mass lesion/mass effect. Scattered areas of hypoattenuation within the  white matter, most likely related to chronic microvascular ischemic disease. Moderate generalized cerebral atrophy with ex vacuo ventricular dilation. Vascular: Calcific atherosclerosis. Skull: Left frontal scalp contusion/laceration without acute fracture. Sinuses/Orbits: Prior functional endoscopic sinus surgery with scattered paranasal sinus mucosal thickening. Polypoid lesions within the nasal cavity, likely polyps. Other: No mastoid effusions. CT CERVICAL SPINE FINDINGS Alignment: Approximately 2 mm of anterolisthesis of C4 on C5 and trace anterolisthesis of C6 on C7, favor degenerative given facet arthropathy at these levels. Skull base and vertebrae: No acute fracture. Vertebral body heights are maintained. Diffuse osteopenia. Soft tissues and spinal canal: No prevertebral fluid or swelling. No visible canal hematoma. Disc levels: Fusion across the facet joints on the left at C2-C3 in the left at C5-C6. Multilevel facet arthropathy. Moderate degenerative disc disease at C5-C6 and C6-C7 where is disc space height loss, endplate irregularity, and posterior disc osteophyte complexes. Craniocervical degenerative change with dorsal pannus along the dens. Upper chest: Irregular masslike consolidation in the partially visualized lung apices with similar findings on prior chest CT from 10/15/2019. Other: None. IMPRESSION: CT head: 1. No evidence of acute intracranial abnormality. 2. Left frontal scalp contusion/laceration without acute fracture. 3. Chronic microvascular ischemic disease and generalized cerebral atrophy. CT cervical spine: 1. No evidence of acute fracture or traumatic malalignment. 2. Degenerative changes, as detailed above. 3. Irregular masslike consolidation in the partially visualized lung apices with similar findings on prior chest CT from 10/15/2019. While this could represent scarring, it is incompletely characterized. Recommend correlation with prior workup/bronchoscopy. Dedicated chest imaging  could further evaluate if clinically indicated. Electronically Signed   By: Margaretha Sheffield MD   On: 08/21/2020 11:30   CT Cervical Spine Wo Contrast  Result Date: 08/21/2020 CLINICAL DATA:  Fall. EXAM: CT HEAD WITHOUT CONTRAST CT CERVICAL SPINE WITHOUT CONTRAST TECHNIQUE: Multidetector CT imaging of the head and cervical spine was performed following the standard protocol without intravenous contrast. Multiplanar CT image reconstructions of the cervical spine were also generated. COMPARISON:  CT chest 10/15/2019. FINDINGS: CT HEAD FINDINGS Brain: No evidence of acute infarction, hemorrhage, hydrocephalus, extra-axial collection or mass lesion/mass effect. Scattered areas of hypoattenuation within the white matter, most likely related to chronic microvascular ischemic disease. Moderate generalized cerebral atrophy with ex vacuo ventricular dilation. Vascular: Calcific atherosclerosis.  Skull: Left frontal scalp contusion/laceration without acute fracture. Sinuses/Orbits: Prior functional endoscopic sinus surgery with scattered paranasal sinus mucosal thickening. Polypoid lesions within the nasal cavity, likely polyps. Other: No mastoid effusions. CT CERVICAL SPINE FINDINGS Alignment: Approximately 2 mm of anterolisthesis of C4 on C5 and trace anterolisthesis of C6 on C7, favor degenerative given facet arthropathy at these levels. Skull base and vertebrae: No acute fracture. Vertebral body heights are maintained. Diffuse osteopenia. Soft tissues and spinal canal: No prevertebral fluid or swelling. No visible canal hematoma. Disc levels: Fusion across the facet joints on the left at C2-C3 in the left at C5-C6. Multilevel facet arthropathy. Moderate degenerative disc disease at C5-C6 and C6-C7 where is disc space height loss, endplate irregularity, and posterior disc osteophyte complexes. Craniocervical degenerative change with dorsal pannus along the dens. Upper chest: Irregular masslike consolidation in the  partially visualized lung apices with similar findings on prior chest CT from 10/15/2019. Other: None. IMPRESSION: CT head: 1. No evidence of acute intracranial abnormality. 2. Left frontal scalp contusion/laceration without acute fracture. 3. Chronic microvascular ischemic disease and generalized cerebral atrophy. CT cervical spine: 1. No evidence of acute fracture or traumatic malalignment. 2. Degenerative changes, as detailed above. 3. Irregular masslike consolidation in the partially visualized lung apices with similar findings on prior chest CT from 10/15/2019. While this could represent scarring, it is incompletely characterized. Recommend correlation with prior workup/bronchoscopy. Dedicated chest imaging could further evaluate if clinically indicated. Electronically Signed   By: Margaretha Sheffield MD   On: 08/21/2020 11:30    Procedures .Marland KitchenLaceration Repair  Date/Time: 08/21/2020 3:44 PM Performed by: Courtney Paris, MD Authorized by: Courtney Paris, MD   Consent:    Consent obtained:  Verbal   Consent given by:  Patient   Risks discussed:  Poor cosmetic result, infection and pain   Alternatives discussed:  No treatment Anesthesia (see MAR for exact dosages):    Anesthesia method:  Topical application Laceration details:    Location:  Face   Face location:  Forehead   Length (cm):  0.5   Depth (mm):  1 Repair type:    Repair type:  Simple Pre-procedure details:    Preparation:  Imaging obtained to evaluate for foreign bodies Exploration:    Wound exploration: wound explored through full range of motion and entire depth of wound probed and visualized     Contaminated: no   Treatment:    Area cleansed with:  Saline   Amount of cleaning:  Standard   Irrigation solution:  Sterile saline   Irrigation method:  Syringe   Visualized foreign bodies/material removed: no   Skin repair:    Repair method:  Sutures   Suture size:  5-0   Suture material:  Fast-absorbing  gut   Suture technique:  Simple interrupted   Number of sutures:  1 Approximation:    Approximation:  Close Post-procedure details:    Dressing:  Antibiotic ointment and non-adherent dressing   Patient tolerance of procedure:  Tolerated well, no immediate complications   (including critical care time)  Medications Ordered in ED Medications  lidocaine-EPINEPHrine-tetracaine (LET) topical gel (3 mLs Topical Given 08/21/20 1210)    ED Course  I have reviewed the triage vital signs and the nursing notes.  Pertinent labs & imaging results that were available during my care of the patient were reviewed by me and considered in my medical decision making (see chart for details).    MDM Rules/Calculators/A&P  Brandy Wright is a 86 y.o. female with a past medical history significant for asthma, GERD, osteoarthritis, and resides in the [facility] memory care unit who presents for a fall.  Patient had unwitnessed fall this morning from standing and has a small hematoma/abrasion to her forehead.  She is complaining of some mild headache and neck pain.  She denies any other injuries and does not think that she syncopized.  She denies any preceding fevers, chills, cough, chest pain, shortness of breath, nausea, vomiting, constipation, diarrhea, or urinary symptoms.  She is only having mild headache.  They sent her here for evaluation given the head trauma.  EMS reports that she is at her mental status baseline.  She does not take blood thinners reported.  On exam, patient is a small hematoma to her left central forehead.  There is small abrasion but no laceration.  Mild tenderness there.  No significant tenderness in her neck which did report some very mild neck discomfort.  Patient is slouched over.  She had clear breath sounds and chest was nontender.  Abdomen nontender.  Moving all extremities.  Normal sensation in her face arms and legs.  No facial droop.  Pupils are  symmetric and reactive with normal extraocular movements.  Clear speech.  Clinically I suspect she has soft tissue injury to her forehead but otherwise has no significant injuries.  We will get CT head and neck to make sure given her age and history of osteoarthritis.  Given her lack of any preceding symptoms and her feeling that she is at her baseline other than a headache, will hold on other lab work at this time.  If imaging is reassuring, anticipate discharge back to her facility.  Imaging showed no acute bleed or skull fracture.  Patient was reassessed and was feeling better but she was having some bleeding from a small laceration that initially I thought was an abrasion.  Patient needs one small suture to help prevent further bleeding.  Absorbable suture was placed in the laceration which stopped the bleeding.  Bacitracin applied.  Patient will follow up with PCP and otherwise is feeling well.  She no other questions or concerns and was discharged in good condition with no other concerning abnormalities on exam, history, or work-up.   Final Clinical Impression(s) / ED Diagnoses Final diagnoses:  Fall, initial encounter  Laceration of forehead, initial encounter    Rx / DC Orders ED Discharge Orders    None     Clinical Impression: 1. Fall, initial encounter   2. Laceration of forehead, initial encounter     Disposition: Discharge  Condition: Good  I have discussed the results, Dx and Tx plan with the pt(& family if present). He/she/they expressed understanding and agree(s) with the plan. Discharge instructions discussed at great length. Strict return precautions discussed and pt &/or family have verbalized understanding of the instructions. No further questions at time of discharge.    New Prescriptions   No medications on file    Follow Up: [facility] [address] [place] [address] [phone]     [facility]  EMERGENCY DEPARTMENT [address] [id number] [place]       Cederick Broadnax, Gwenyth Allegra, MD 08/21/20 [phone]

## 2020-08-21 NOTE — ED Notes (Signed)
Applied let gel to forehead hematoma in preporation for suture. Pt resting in bed. NADN

## 2020-08-21 NOTE — ED Notes (Signed)
Pt resting in bed. NADN. [facility] inbound for discharge transport. Report to [place] at [facility].

## 2020-08-21 NOTE — ED Notes (Addendum)
Pt resting in bed. NADN 

## 2020-08-21 NOTE — ED Notes (Signed)
Called [facility] for transport.  

## 2020-08-21 NOTE — Discharge Instructions (Signed)
Your CT today did not show any evidence of new acute injury to your head or neck.  Previous findings were unchanged.  You did have a small laceration to your forehead that continue to bleed despite being very small.  I put 1 absorbable suture in place which will absorb on its own and you will not need to have removed.  Please rest and stay hydrated.  Otherwise you appeared well and had no other complaints.  Please follow-up with your primary doctor.  Please be careful not to fall.  If any symptoms change or worsen, please return to the nearest emergency department.

## 2020-09-27 ENCOUNTER — Emergency Department (HOSPITAL_COMMUNITY): Payer: Medicare Other

## 2020-09-27 ENCOUNTER — Emergency Department (HOSPITAL_COMMUNITY)
Admission: EM | Admit: 2020-09-27 | Discharge: 2020-09-28 | Disposition: A | Discharge status: Home / Self Care | Payer: Medicare Other | Attending: Emergency Medicine | Admitting: Emergency Medicine

## 2020-09-27 DIAGNOSIS — S52502S Unspecified fracture of the lower end of left radius, sequela: Secondary | ICD-10-CM

## 2020-09-27 DIAGNOSIS — Y92128 Other place in nursing home as the place of occurrence of the external cause: Secondary | ICD-10-CM | POA: Diagnosis not present

## 2020-09-27 DIAGNOSIS — W1839XA Other fall on same level, initial encounter: Secondary | ICD-10-CM | POA: Diagnosis not present

## 2020-09-27 DIAGNOSIS — W19XXXA Unspecified fall, initial encounter: Secondary | ICD-10-CM

## 2020-09-27 DIAGNOSIS — F0390 Unspecified dementia without behavioral disturbance: Secondary | ICD-10-CM | POA: Diagnosis not present

## 2020-09-27 DIAGNOSIS — J4520 Mild intermittent asthma, uncomplicated: Secondary | ICD-10-CM | POA: Diagnosis not present

## 2020-09-27 DIAGNOSIS — S0003XA Contusion of scalp, initial encounter: Secondary | ICD-10-CM | POA: Insufficient documentation

## 2020-09-27 DIAGNOSIS — S8012XA Contusion of left lower leg, initial encounter: Secondary | ICD-10-CM | POA: Diagnosis not present

## 2020-09-27 DIAGNOSIS — S40022A Contusion of left upper arm, initial encounter: Secondary | ICD-10-CM | POA: Insufficient documentation

## 2020-09-27 DIAGNOSIS — S4992XA Unspecified injury of left shoulder and upper arm, initial encounter: Secondary | ICD-10-CM | POA: Diagnosis present

## 2020-09-27 LAB — CBG MONITORING, ED
Glucose-Capillary: 108 mg/dL — ABNORMAL HIGH (ref 70–99)
Glucose-Capillary: 97 mg/dL (ref 70–99)

## 2020-09-27 LAB — CBC
HCT: 35.8 % — ABNORMAL LOW (ref 36.0–46.0)
Hemoglobin: 11.3 g/dL — ABNORMAL LOW (ref 12.0–15.0)
MCH: 27.2 pg (ref 26.0–34.0)
MCHC: 31.6 g/dL (ref 30.0–36.0)
MCV: 86.1 fL (ref 80.0–100.0)
Platelets: 332 10*3/uL (ref 150–400)
RBC: 4.16 MIL/uL (ref 3.87–5.11)
RDW: 14.6 % (ref 11.5–15.5)
WBC: 6.7 10*3/uL (ref 4.0–10.5)
nRBC: 0 % (ref 0.0–0.2)

## 2020-09-27 LAB — BASIC METABOLIC PANEL
Anion gap: 9 (ref 5–15)
BUN: 18 mg/dL (ref 8–23)
CO2: 27 mmol/L (ref 22–32)
Calcium: 8.8 mg/dL — ABNORMAL LOW (ref 8.9–10.3)
Chloride: 105 mmol/L (ref 98–111)
Creatinine, Ser: 0.9 mg/dL (ref 0.44–1.00)
GFR, Estimated: >60 mL/min (ref >60)
Glucose, Bld: 115 mg/dL — ABNORMAL HIGH (ref 70–99)
Potassium: 3.6 mmol/L (ref 3.5–5.1)
Sodium: 141 mmol/L (ref 135–145)

## 2020-09-27 NOTE — Discharge Instructions (Signed)
You have a old left forearm fracture and no new fractures today.   Your lab work and CT scans were unremarkable  Fall precautions  See your doctor   Return to ER if you have worse left wrist pain, another fall, vomiting.

## 2020-09-27 NOTE — ED Notes (Addendum)
Called and provided update to daughter regarding patient discharge back to facility.

## 2020-09-27 NOTE — ED Notes (Signed)
Report given to [place] at [facility]

## 2020-09-27 NOTE — ED Notes (Signed)
Called [facility] for patient to go to [facility].

## 2020-09-27 NOTE — ED Triage Notes (Signed)
Pt BIB by EMS from [facility] unwitnessed fall during the night. Pt with L hand pain, shortening to L leg pain, swelling, and shortening. No deformity felt by EMS. Also bruise on forehead. Hx dementia, A/O x 1 today, which is her baseline. CBG 391 with no hx of diabetes. Staff reports general decline in mentation and mobility since arriving to facility in February.

## 2020-09-27 NOTE — ED Notes (Signed)
Patient to CT.

## 2020-09-27 NOTE — ED Notes (Signed)
Called and provided updated to daughter, Thamas Jaegers [phone], per her request. She denies further questions.

## 2020-09-27 NOTE — ED Notes (Signed)
Unit secretary to contact [facility] for transport back to [facility].

## 2020-09-27 NOTE — ED Provider Notes (Signed)
[facility] DEPARTMENT Provider Note   CSN: [id number] Arrival date & time: 09/27/20  1511     History Chief Complaint  Patient presents with  . Fall    Brandy Wright is a 86 y.o. female history of asthma, dementia here presenting with fall.  Patient is from a nursing home.  Unclear how she fell earlier.  Was unwitnessed fall and there was noted to have bruising in the left forearm and left leg pain.  Patient also noted to have a hematoma in the scalp area as well.  The history is provided by the patient.       Past Medical History:  Diagnosis Date  . Allergic arthritis   . Asthma   . Cataract    bilateral  . GERD (gastroesophageal reflux disease)   . Osteoarthritis     Patient Active Problem List   Diagnosis Date Noted  . Nasal congestion 06/11/2016  . Asthma, mild intermittent 06/09/2016  . Chest tightness 06/09/2016  . Cognitive changes 06/09/2016  . Cataracts, bilateral 07/10/2014  . Asthma, chronic 07/10/2014  . GERD (gastroesophageal reflux disease) 07/10/2014  . Palpitations 07/10/2014  . Tachycardia 07/10/2014    Past Surgical History:  Procedure Laterality Date  . ABDOMINAL HYSTERECTOMY    . BREAST SURGERY     2 biopsy  . BRONCHOSCOPY    . NASAL SINUS SURGERY       OB History   No obstetric history on file.     Family History  Problem Relation Age of Onset  . Alzheimer's disease Mother   . Osteoarthritis Mother   . Cancer Mother        colon  . Cancer Father        colon  . Multiple sclerosis Brother   . Myasthenia gravis Brother   . Myasthenia gravis Brother     Social History   Tobacco Use  . Smoking status: Never Smoker  . Smokeless tobacco: Never Used  Substance Use Topics  . Alcohol use: No  . Drug use: No    Home Medications Prior to Admission medications   Medication Sig Start Date End Date Taking? Authorizing Provider  albuterol (PROVENTIL HFA;VENTOLIN HFA) 108 (90 Base) MCG/ACT inhaler  Inhale 2 puffs into the lungs every 6 (six) hours as needed for wheezing or shortness of breath. 06/09/16   Breeback, Jade L, PA-C  atorvastatin (LIPITOR) 10 MG tablet Take 10 mg by mouth at bedtime.    [provider]  azelastine (ASTELIN) 0.1 % nasal spray Place 2 sprays into both nostrils 2 (two) times daily. Use in each nostril as directed Patient not taking: Reported on 04/26/2020 12/09/15   Donella Stade, PA-C  cetirizine (ZYRTEC) 10 MG tablet Take 10 mg by mouth daily.    [provider]  Cholecalciferol (VITAMIN D-3) 25 MCG (1000 UT) CAPS Take 1,000 Units by mouth daily.    [provider]  fluticasone (FLOVENT HFA) 220 MCG/ACT inhaler Inhale 2 puffs into the lungs 2 (two) times daily.    [provider]  melatonin 3 MG TABS tablet Take 3 mg by mouth at bedtime.    [provider]  mometasone (NASONEX) 50 MCG/ACT nasal spray Place 2 sprays into the nose daily. Patient not taking: Reported on 04/26/2020 03/01/16   Donella Stade, PA-C  neomycin-bacitracin-polymyxin (NEOSPORIN) [phone] ointment Apply 1 application topically daily. Right buttock    [provider]  OLANZapine (ZYPREXA) 7.5 MG tablet Take 7.5 mg by  mouth at bedtime.    [provider]  sennosides-docusate sodium (SENOKOT-S) 8.6-50 MG tablet Take 2 tablets by mouth in the morning and at bedtime.    [provider]  sertraline (ZOLOFT) 100 MG tablet Take 100 mg by mouth daily.    [provider]    Allergies    Cephalexin, Penicillins, Sulfamethoxazole-trimethoprim, Aspirin, Atrovent [ipratropium], Qnasl [beclomethasone], Zithromax [azithromycin], Ciprofloxacin, and Pseudoephedrine hcl  Review of Systems   Review of Systems  Musculoskeletal:       L hand and leg pain   All other systems reviewed and are negative.   Physical Exam Updated Vital Signs BP 111/62   Pulse 88   Temp 98.1 F (36.7 C) (Oral)   Resp 20   SpO2 92%   Physical  Exam Vitals and nursing note reviewed.  Constitutional:      Comments: Demented, confused   HENT:     Head: Normocephalic.     Comments: Bruising L temporal area, no obvious hematoma or laceration     Mouth/Throat:     Mouth: Mucous membranes are moist.  Eyes:     Extraocular Movements: Extraocular movements intact.     Pupils: Pupils are equal, round, and reactive to light.  Cardiovascular:     Rate and Rhythm: Normal rate and regular rhythm.     Pulses: Normal pulses.     Heart sounds: Normal heart sounds.  Pulmonary:     Effort: Pulmonary effort is normal.     Breath sounds: Normal breath sounds.  Abdominal:     General: Abdomen is flat.     Palpations: Abdomen is soft.  Musculoskeletal:     Cervical back: Normal range of motion and neck supple.     Comments: Bruising L forearm but able to range the wrist. Nl ROM bilateral hips, no obvious spinal tenderness or other extremity trauma   Skin:    General: Skin is warm.     Capillary Refill: Capillary refill takes less than 2 seconds.  Neurological:     General: No focal deficit present.     Comments: Demented. Confused. Moving all extremities   Psychiatric:        Mood and Affect: Mood normal.     ED Results / Procedures / Treatments   Labs (all labs ordered are listed, but only abnormal results are displayed) Labs Reviewed  BASIC METABOLIC PANEL - Abnormal; Notable for the following components:      Result Value   Glucose, Bld 115 (*)    Calcium 8.8 (*)    All other components within normal limits  CBC - Abnormal; Notable for the following components:   Hemoglobin 11.3 (*)    HCT 35.8 (*)    All other components within normal limits  CBG MONITORING, ED - Abnormal; Notable for the following components:   Glucose-Capillary 108 (*)    All other components within normal limits  URINALYSIS, ROUTINE W REFLEX MICROSCOPIC  CBG MONITORING, ED    EKG None  Radiology DG Hand Complete Left  Result Date:  09/27/2020 CLINICAL DATA:  Pain post fall EXAM: LEFT HAND - COMPLETE 3+ VIEW COMPARISON:  None available FINDINGS: No fracture or dislocation. Diffuse osteopenia. Corticated ossicle at the ulnar styloid. Degenerative spurring at all interphalangeal joints, and at the first [facility] articulation. Mild narrowing of the articular cartilage in the carpus with mild remodeling of the distal radial articular surface. There is diffuse soft tissue swelling predominantly dorsally. Old fracture deformity of the distal  radius with dorsal angulation of the distal radial articular surface. IMPRESSION: 1. No acute bone abnormality. 2. Old fracture deformity of the distal radius with dorsal angulation. Electronically Signed   By: Lucrezia Europe M.D.   On: 09/27/2020 16:13   DG Hip Unilat W or Wo Pelvis 2-3 Views Left  Result Date: 09/27/2020 CLINICAL DATA:  Pain post fall EXAM: DG HIP (WITH OR WITHOUT PELVIS) 2-3V LEFT COMPARISON:  None. FINDINGS: Sliding femoral neck screw and IM rod with distal interlocking screw, distal extent not visualized. Chronic appearing deformity of the superior left pubic ramus, without acute cortical interruption. No acute fracture or dislocation. Left hip DJD. Diffuse osteopenia. Benign appearing left pelvic calcifications. IMPRESSION: No acute findings. Electronically Signed   By: Lucrezia Europe M.D.   On: 09/27/2020 16:11    Procedures Procedures (including critical care time)  Medications Ordered in ED Medications - No data to display  ED Course  I have reviewed the triage vital signs and the nursing notes.  Pertinent labs & imaging results that were available during my care of the patient were reviewed by me and considered in my medical decision making (see chart for details).    MDM Rules/Calculators/A&P                         Brandy Wright is a 86 y.o. female here with fall. Unwitnessed fall at nursing home. Has bruising L arm and tenderness L hip.  Will get CT head and neck and labs  and x-rays  11:07 PM CTs and labs unremarkable.  X-ray showed old left distal radius fracture.  Patient is ambulatory at this point she is stable for discharge back to facility.  Final Clinical Impression(s) / ED Diagnoses Final diagnoses:  None    Rx / DC Orders ED Discharge Orders    None       Drenda Freeze, MD 09/27/20 2308

## 2020-09-28 NOTE — ED Notes (Signed)
Multiple medical necessity forms completed due to issue with computer/printer.

## 2020-10-08 ENCOUNTER — Other Ambulatory Visit: Payer: Self-pay

## 2020-10-08 ENCOUNTER — Emergency Department (HOSPITAL_COMMUNITY)
Admission: EM | Admit: 2020-10-08 | Discharge: 2020-10-08 | Disposition: A | Discharge status: Home / Self Care | Payer: Medicare Other | Attending: Emergency Medicine | Admitting: Emergency Medicine

## 2020-10-08 ENCOUNTER — Emergency Department (HOSPITAL_COMMUNITY): Payer: Medicare Other

## 2020-10-08 DIAGNOSIS — Y92129 Unspecified place in nursing home as the place of occurrence of the external cause: Secondary | ICD-10-CM | POA: Insufficient documentation

## 2020-10-08 DIAGNOSIS — S0990XA Unspecified injury of head, initial encounter: Secondary | ICD-10-CM | POA: Diagnosis present

## 2020-10-08 DIAGNOSIS — N3000 Acute cystitis without hematuria: Secondary | ICD-10-CM | POA: Diagnosis not present

## 2020-10-08 DIAGNOSIS — Z7951 Long term (current) use of inhaled steroids: Secondary | ICD-10-CM | POA: Diagnosis not present

## 2020-10-08 DIAGNOSIS — F0390 Unspecified dementia without behavioral disturbance: Secondary | ICD-10-CM | POA: Diagnosis not present

## 2020-10-08 DIAGNOSIS — S0101XA Laceration without foreign body of scalp, initial encounter: Secondary | ICD-10-CM | POA: Insufficient documentation

## 2020-10-08 DIAGNOSIS — W06XXXA Fall from bed, initial encounter: Secondary | ICD-10-CM | POA: Insufficient documentation

## 2020-10-08 DIAGNOSIS — J45909 Unspecified asthma, uncomplicated: Secondary | ICD-10-CM | POA: Diagnosis not present

## 2020-10-08 LAB — URINALYSIS, ROUTINE W REFLEX MICROSCOPIC
Bilirubin Urine: NEGATIVE
Glucose, UA: NEGATIVE mg/dL
Hgb urine dipstick: NEGATIVE
Ketones, ur: NEGATIVE mg/dL
Nitrite: POSITIVE — AB
Protein, ur: NEGATIVE mg/dL
Specific Gravity, Urine: 1.016 (ref 1.005–1.030)
pH: 5 (ref 5.0–8.0)

## 2020-10-08 LAB — CBC WITH DIFFERENTIAL/PLATELET
Abs Immature Granulocytes: 0.04 10*3/uL (ref 0.00–0.07)
Basophils Absolute: 0 10*3/uL (ref 0.0–0.1)
Basophils Relative: 0 %
Eosinophils Absolute: 0.1 10*3/uL (ref 0.0–0.5)
Eosinophils Relative: 1 %
HCT: 36.6 % (ref 36.0–46.0)
Hemoglobin: 11.4 g/dL — ABNORMAL LOW (ref 12.0–15.0)
Immature Granulocytes: 1 %
Lymphocytes Relative: 7 %
Lymphs Abs: 0.6 10*3/uL — ABNORMAL LOW (ref 0.7–4.0)
MCH: 26.4 pg (ref 26.0–34.0)
MCHC: 31.1 g/dL (ref 30.0–36.0)
MCV: 84.7 fL (ref 80.0–100.0)
Monocytes Absolute: 0.8 10*3/uL (ref 0.1–1.0)
Monocytes Relative: 11 %
Neutro Abs: 6 10*3/uL (ref 1.7–7.7)
Neutrophils Relative %: 80 %
Platelets: 294 10*3/uL (ref 150–400)
RBC: 4.32 MIL/uL (ref 3.87–5.11)
RDW: 14.8 % (ref 11.5–15.5)
WBC: 7.6 10*3/uL (ref 4.0–10.5)
nRBC: 0 % (ref 0.0–0.2)

## 2020-10-08 LAB — BASIC METABOLIC PANEL
Anion gap: 8 (ref 5–15)
BUN: 22 mg/dL (ref 8–23)
CO2: 28 mmol/L (ref 22–32)
Calcium: 9 mg/dL (ref 8.9–10.3)
Chloride: 102 mmol/L (ref 98–111)
Creatinine, Ser: 0.71 mg/dL (ref 0.44–1.00)
GFR, Estimated: >60 mL/min (ref >60)
Glucose, Bld: 105 mg/dL — ABNORMAL HIGH (ref 70–99)
Potassium: 4.2 mmol/L (ref 3.5–5.1)
Sodium: 138 mmol/L (ref 135–145)

## 2020-10-08 MED ORDER — NITROFURANTOIN MONOHYD MACRO 100 MG PO CAPS
100.0000 mg | ORAL_CAPSULE | Freq: Once | ORAL | Status: AC
  Administered 2020-10-08: 100 mg via ORAL
  Filled 2020-10-08: qty 1

## 2020-10-08 MED ORDER — NITROFURANTOIN MONOHYD MACRO 100 MG PO CAPS: 100.0000 mg | ORAL_CAPSULE | Freq: Two times a day (BID) | ORAL | 0 refills | Status: DC

## 2020-10-08 NOTE — ED Notes (Signed)
Staff from [facility] called and wanted update and advised that pt was being discharged.

## 2020-10-08 NOTE — ED Notes (Signed)
[facility] here for transport. 

## 2020-10-08 NOTE — ED Triage Notes (Signed)
Pt to ER via EMS from [facility] after falling from bed.  Pt was found by staff on the floor.  Laceration present to pt's left forehead.  Bleeding controlled at this time.  Pt reports pain in head.  Pt not on blood thinners.  EMS attempted to place C collar, but were unsuccessful due to pt's posture.

## 2020-10-08 NOTE — ED Provider Notes (Signed)
[facility] DEPT Provider Note   CSN: [id number] Arrival date & time: 10/08/20  4098     History Chief Complaint  Patient presents with  . Fall    Brandy Wright is a 86 y.o. female.  HPI   Patient presented to the ED for evaluation after a presumed fall at her nursing home.  Patient has a history of dementia.  Patient was at [facility] and this morning they found her next to the bed.  Patient appeared to have a laceration on left forehead and they noted dried blood around her face.  Patient apparently reported some pain in her head.  In the ED was hard to get any history out of her.  Patient is primarily mumbling when I speak to her.  She is denying any specific complaints at this time.  Past Medical History:  Diagnosis Date  . Allergic arthritis   . Asthma   . Cataract    bilateral  . GERD (gastroesophageal reflux disease)   . Osteoarthritis     Patient Active Problem List   Diagnosis Date Noted  . Nasal congestion 06/11/2016  . Asthma, mild intermittent 06/09/2016  . Chest tightness 06/09/2016  . Cognitive changes 06/09/2016  . Cataracts, bilateral 07/10/2014  . Asthma, chronic 07/10/2014  . GERD (gastroesophageal reflux disease) 07/10/2014  . Palpitations 07/10/2014  . Tachycardia 07/10/2014    Past Surgical History:  Procedure Laterality Date  . ABDOMINAL HYSTERECTOMY    . BREAST SURGERY     2 biopsy  . BRONCHOSCOPY    . NASAL SINUS SURGERY       OB History   No obstetric history on file.     Family History  Problem Relation Age of Onset  . Alzheimer's disease Mother   . Osteoarthritis Mother   . Cancer Mother        colon  . Cancer Father        colon  . Multiple sclerosis Brother   . Myasthenia gravis Brother   . Myasthenia gravis Brother     Social History   Tobacco Use  . Smoking status: Never Smoker  . Smokeless tobacco: Never Used  Substance Use Topics  . Alcohol use: No  . Drug use: No     Home Medications Prior to Admission medications   Medication Sig Start Date End Date Taking? Authorizing Provider  albuterol (PROVENTIL HFA;VENTOLIN HFA) 108 (90 Base) MCG/ACT inhaler Inhale 2 puffs into the lungs every 6 (six) hours as needed for wheezing or shortness of breath. 06/09/16   Breeback, Jade L, PA-C  atorvastatin (LIPITOR) 10 MG tablet Take 10 mg by mouth at bedtime.    [provider]  azelastine (ASTELIN) 0.1 % nasal spray Place 2 sprays into both nostrils 2 (two) times daily. Use in each nostril as directed Patient not taking: Reported on 04/26/2020 12/09/15   Donella Stade, PA-C  cetirizine (ZYRTEC) 10 MG tablet Take 10 mg by mouth daily.    [provider]  Cholecalciferol (VITAMIN D-3) 25 MCG (1000 UT) CAPS Take 1,000 Units by mouth daily.    [provider]  fluticasone (FLOVENT HFA) 220 MCG/ACT inhaler Inhale 2 puffs into the lungs 2 (two) times daily.    [provider]  melatonin 3 MG TABS tablet Take 3 mg by mouth at bedtime.    [provider]  mometasone (NASONEX) 50 MCG/ACT nasal spray Place 2 sprays into the nose daily. Patient not taking: Reported on 04/26/2020 03/01/16  Breeback, Jade L, PA-C  neomycin-bacitracin-polymyxin (NEOSPORIN) [phone] ointment Apply 1 application topically daily. Right buttock    [provider]  nitrofurantoin, macrocrystal-monohydrate, (MACROBID) 100 MG capsule Take 1 capsule (100 mg total) by mouth 2 (two) times daily. 10/08/20   Dorie Rank, MD  OLANZapine (ZYPREXA) 7.5 MG tablet Take 7.5 mg by mouth at bedtime.    [provider]  sennosides-docusate sodium (SENOKOT-S) 8.6-50 MG tablet Take 2 tablets by mouth in the morning and at bedtime.    [provider]  sertraline (ZOLOFT) 100 MG tablet Take 100 mg by mouth daily.    [provider]    Allergies    Cephalexin, Penicillins, Sulfamethoxazole-trimethoprim, Aspirin, Atrovent [ipratropium], Qnasl  [beclomethasone], Zithromax [azithromycin], Ciprofloxacin, and Pseudoephedrine hcl  Review of Systems   Review of Systems  Unable to perform ROS: Dementia    Physical Exam Updated Vital Signs BP 105/63   Pulse 80   Temp 97.9 F (36.6 C) (Oral)   Resp 17   SpO2 93%   Physical Exam Vitals and nursing note reviewed.  Constitutional:      Appearance: She is well-developed and well-nourished. She is not ill-appearing or diaphoretic.     Comments: Elderly frail  HENT:     Head: Normocephalic.     Comments: Laceration left lateral forehead, no active bleeding, dried blood    Right Ear: External ear normal.     Left Ear: External ear normal.  Eyes:     General: No scleral icterus.       Right eye: No discharge.        Left eye: No discharge.     Conjunctiva/sclera: Conjunctivae normal.  Neck:     Trachea: No tracheal deviation.  Cardiovascular:     Rate and Rhythm: Normal rate and regular rhythm.     Pulses: Intact distal pulses.  Pulmonary:     Effort: Pulmonary effort is normal. No respiratory distress.     Breath sounds: Normal breath sounds. No stridor. No wheezing or rales.  Abdominal:     General: Bowel sounds are normal. There is no distension.     Palpations: Abdomen is soft.     Tenderness: There is no abdominal tenderness. There is no guarding or rebound.  Musculoskeletal:        General: No tenderness or edema.     Cervical back: Neck supple.     Comments: All 4 extremities put through range of motion, no deformity, no tenderness, no pain or grimacing with movement, no spinal ttp  Skin:    General: Skin is warm.     Findings: No rash.  Neurological:     Mental Status: She is alert.     Cranial Nerves: No cranial nerve deficit (no facial droop, extraocular movements intact, mumbling speech).     Sensory: No sensory deficit.     Motor: No tremor or seizure activity.     Deep Tendon Reflexes: Strength normal.  Psychiatric:        Mood and Affect: Mood and  affect normal.     ED Results / Procedures / Treatments   Labs (all labs ordered are listed, but only abnormal results are displayed) Labs Reviewed  CBC WITH DIFFERENTIAL/PLATELET - Abnormal; Notable for the following components:      Result Value   Hemoglobin 11.4 (*)    Lymphs Abs 0.6 (*)    All other components within normal limits  BASIC METABOLIC PANEL - Abnormal; Notable for the following components:  Glucose, Bld 105 (*)    All other components within normal limits  URINALYSIS, ROUTINE W REFLEX MICROSCOPIC - Abnormal; Notable for the following components:   APPearance HAZY (*)    Nitrite POSITIVE (*)    Leukocytes,Ua MODERATE (*)    Bacteria, UA MANY (*)    All other components within normal limits  URINE CULTURE    EKG EKG Interpretation  Date/Time:  Wednesday October 08 2020 07:39:54 EST Ventricular Rate:  100 PR Interval:    QRS Duration: 77 QT Interval:  347 QTC Calculation: 448 R Axis:   98 Text Interpretation: Right and left arm electrode reversal, interpretation assumes no reversal Sinus tachycardia Atrial premature complex Right axis deviation Minimal ST depression, diffuse leads Artifact in lead(s) I II III aVR aVL aVF V1 V2 Poor data quality in current ECG precludes serial comparison Confirmed by Dorie Rank [phone]) on 10/08/2020 7:47:13 AM   Radiology CT Head Wo Contrast  Result Date: 10/08/2020 CLINICAL DATA:  Pain following fall EXAM: CT HEAD WITHOUT CONTRAST CT CERVICAL SPINE WITHOUT CONTRAST TECHNIQUE: Multidetector CT imaging of the head and cervical spine was performed following the standard protocol without intravenous contrast. Multiplanar CT image reconstructions of the cervical spine were also generated. COMPARISON:  CT head and CT cervical spine September 27, 2020 FINDINGS: CT HEAD FINDINGS Brain: Stable mild to moderate diffuse atrophy. There is no intracranial mass, hemorrhage, extra-axial fluid collection, or midline shift. Patchy small vessel  disease in the centra semiovale bilaterally is stable. No acute appearing infarct is evident. Vascular: There is no hyperdense vessel. There is calcification in the carotid siphon regions. Skull: Bony calvarium appears intact. There is a left frontal scalp hematoma. Sinuses/Orbits: Postoperative antrostomies with areas of mucosal thickening, stable. Mucosal thickening and ill-defined opacification in areas of nasal cavity and ethmoid air cells, stable. Visualized orbits appear symmetric bilaterally. Other: Mastoid air cells are clear. CT CERVICAL SPINE FINDINGS Alignment: There is 1 mm of anterolisthesis of C4 on C5, stable. There is 2 mm of anterolisthesis of C5 on C6, stable. There is 1 mm of anterolisthesis of C6 on C7, stable. No new spondylolisthesis. Skull base and vertebrae: The skull base and craniocervical junction appear stable. There is mild pannus posterior to the odontoid which does not impress upon the craniocervical junction. There is a degree of underlying osteoporosis. No evident fracture. No blastic or lytic bone lesions. Soft tissues and spinal canal: Prevertebral soft tissues and predental space regions are normal. There is no appreciable cord or canal hematoma. No paraspinous lesions. Disc levels: Disc space narrowing is again noted at C5-6 and C6-7. There is facet hypertrophy at multiple levels, stable. No nerve root edema or effacement. No disc extrusion or stenosis. Upper chest: There is chronic and stable appearing pleural thickening and fibrosis in the visualized upper lung regions. No new opacity evident in these areas. Other: There are foci of carotid artery calcification bilaterally. There is also calcification in the proximal right subclavian artery. IMPRESSION: CT head: Stable atrophy with periventricular small vessel disease. No mass, hemorrhage, or extra-axial fluid collection. No acute infarct evident. Left frontal scalp hematoma.  No fracture. Foci of arterial vascular  calcification. Postoperative changes in maxillary antra with areas of ethmoid and nasal cavity opacification and mucosal thickening. CT cervical spine: No fracture. Stable slight spondylolisthesis at C4-5, C5-6 and C6-7, likely due to underlying spondylosis. No new spondylolisthesis. Osteoarthritic change at multiple levels. No frank disc extrusion or stenosis. Scarring and fibrosis with pleural thickening in the  visualized upper lung regions, stable. Foci of arterial vascular calcification at several sites. Electronically Signed   By: Lowella Grip III M.D.   On: 10/08/2020 09:55   CT Cervical Spine Wo Contrast  Result Date: 10/08/2020 CLINICAL DATA:  Pain following fall EXAM: CT HEAD WITHOUT CONTRAST CT CERVICAL SPINE WITHOUT CONTRAST TECHNIQUE: Multidetector CT imaging of the head and cervical spine was performed following the standard protocol without intravenous contrast. Multiplanar CT image reconstructions of the cervical spine were also generated. COMPARISON:  CT head and CT cervical spine September 27, 2020 FINDINGS: CT HEAD FINDINGS Brain: Stable mild to moderate diffuse atrophy. There is no intracranial mass, hemorrhage, extra-axial fluid collection, or midline shift. Patchy small vessel disease in the centra semiovale bilaterally is stable. No acute appearing infarct is evident. Vascular: There is no hyperdense vessel. There is calcification in the carotid siphon regions. Skull: Bony calvarium appears intact. There is a left frontal scalp hematoma. Sinuses/Orbits: Postoperative antrostomies with areas of mucosal thickening, stable. Mucosal thickening and ill-defined opacification in areas of nasal cavity and ethmoid air cells, stable. Visualized orbits appear symmetric bilaterally. Other: Mastoid air cells are clear. CT CERVICAL SPINE FINDINGS Alignment: There is 1 mm of anterolisthesis of C4 on C5, stable. There is 2 mm of anterolisthesis of C5 on C6, stable. There is 1 mm of anterolisthesis of  C6 on C7, stable. No new spondylolisthesis. Skull base and vertebrae: The skull base and craniocervical junction appear stable. There is mild pannus posterior to the odontoid which does not impress upon the craniocervical junction. There is a degree of underlying osteoporosis. No evident fracture. No blastic or lytic bone lesions. Soft tissues and spinal canal: Prevertebral soft tissues and predental space regions are normal. There is no appreciable cord or canal hematoma. No paraspinous lesions. Disc levels: Disc space narrowing is again noted at C5-6 and C6-7. There is facet hypertrophy at multiple levels, stable. No nerve root edema or effacement. No disc extrusion or stenosis. Upper chest: There is chronic and stable appearing pleural thickening and fibrosis in the visualized upper lung regions. No new opacity evident in these areas. Other: There are foci of carotid artery calcification bilaterally. There is also calcification in the proximal right subclavian artery. IMPRESSION: CT head: Stable atrophy with periventricular small vessel disease. No mass, hemorrhage, or extra-axial fluid collection. No acute infarct evident. Left frontal scalp hematoma.  No fracture. Foci of arterial vascular calcification. Postoperative changes in maxillary antra with areas of ethmoid and nasal cavity opacification and mucosal thickening. CT cervical spine: No fracture. Stable slight spondylolisthesis at C4-5, C5-6 and C6-7, likely due to underlying spondylosis. No new spondylolisthesis. Osteoarthritic change at multiple levels. No frank disc extrusion or stenosis. Scarring and fibrosis with pleural thickening in the visualized upper lung regions, stable. Foci of arterial vascular calcification at several sites. Electronically Signed   By: Lowella Grip III M.D.   On: 10/08/2020 09:55    Procedures .Marland KitchenLaceration Repair  Date/Time: 10/08/2020 11:22 AM Performed by: Dorie Rank, MD Authorized by: Dorie Rank, MD    Consent:    Risks discussed:  Infection, need for additional repair, pain, poor cosmetic result and poor wound healing   Alternatives discussed:  No treatment and delayed treatment Anesthesia:    Anesthesia method:  None Laceration details:    Location: left forehead. Treatment:    Area cleansed with:  Saline   Amount of cleaning:  Standard   Irrigation solution:  Sterile saline   Visualized foreign bodies/material removed: no  Debridement:  None   Undermining:  None   Scar revision: no   Skin repair:    Repair method:  Tissue adhesive Approximation:    Approximation:  Close Repair type:    Repair type:  Simple Post-procedure details:    Dressing:  Open (no dressing)   (including critical care time)  Medications Ordered in ED Medications  nitrofurantoin (macrocrystal-monohydrate) (MACROBID) capsule 100 mg (has no administration in time range)    ED Course  I have reviewed the triage vital signs and the nursing notes.  Pertinent labs & imaging results that were available during my care of the patient were reviewed by me and considered in my medical decision making (see chart for details).  Clinical Course as of 10/08/20 1122  Wed Oct 08, 2020  1045 Urinalysis does suggest UTI.  Laboratory tests otherwise unremarkable. [JK]  1045 Head CT and C-spine CT are notable for scalp contusion but no signs of fracture or intracranial bleeding [JK]  1119 Attempted to call patient's daughter.  No answer. [JK]    Clinical Course User Index [JK] Dorie Rank, MD   MDM Rules/Calculators/A&P                          Patient presented to the ED for evaluation after a fall at the nursing facility.  Patient does appear to have advanced dementia.  Patient's ED work-up does not show any signs of any acute laboratory abnormalities.  CT scan does not show any signs of severe injury.  Patient's urinalysis is consistent with urinary tract infection.  Patient was given a dose of antibiotics.   Skin laceration was also repaired.  Plan on discharge back to the nursing facility.  I attempted to call the patient's daughter but there was no answer. Final Clinical Impression(s) / ED Diagnoses Final diagnoses:  Laceration of scalp, initial encounter  Acute cystitis without hematuria    Rx / DC Orders ED Discharge Orders         Ordered    nitrofurantoin, macrocrystal-monohydrate, (MACROBID) 100 MG capsule  2 times daily        10/08/20 1120           Dorie Rank, MD 10/08/20 1123

## 2020-10-08 NOTE — ED Notes (Signed)
In and out cath per sterile techinque.

## 2020-10-08 NOTE — ED Notes (Signed)
Called and spoke with pts daughter [name]. Advised her that she was being discharged and waiting on transport.

## 2020-10-08 NOTE — ED Notes (Signed)
Daughter wishes to be updated on pt condition/outcome.  Daughter's [facility]) number is [phone].

## 2020-10-08 NOTE — ED Notes (Signed)
[facility] called for transport.  

## 2020-10-08 NOTE — Discharge Instructions (Signed)
Take the antibiotics as prescribed.  The test today did show that you have a urinary tract infection.  The skin glue on your laceration will eventually peel off on its own.  Avoid putting any antibiotic ointment on that area.

## 2020-10-08 NOTE — ED Notes (Addendum)
Pt placed on purewick at 70 mmHg. Dermabond at bedside.

## 2020-10-11 LAB — URINE CULTURE: Culture: >=100000 — AB

## 2020-10-12 NOTE — Progress Notes (Signed)
ED Antimicrobial Stewardship Positive Culture Follow Up   Brandy Wright is an 86 y.o. female who presented to [facility] on 10/08/2020 with a chief complaint of  Chief Complaint  Patient presents with  . Fall    Recent Results (from the past 720 hour(s))  Urine Culture     Status: Abnormal   Collection Time: 10/08/20 11:21 AM   Specimen: Urine, Clean Catch  Result Value Ref Range Status   Specimen Description   Final    URINE, CLEAN CATCH Performed at [facility], [place] [address], [place], [place] [address]    Special Requests   Final    NONE Performed at [facility], [place] [address] [place], [place] [address]    Culture >=100,000 COLONIES/mL KLEBSIELLA PNEUMONIAE (A)  Final   Report Status 10/11/2020 FINAL  Final   Organism ID, Bacteria KLEBSIELLA PNEUMONIAE (A)  Final      Susceptibility   Klebsiella pneumoniae - MIC*    AMPICILLIN >=32 RESISTANT Resistant     CEFAZOLIN <=4 SENSITIVE Sensitive     CEFEPIME <=0.12 SENSITIVE Sensitive     CEFTRIAXONE <=0.25 SENSITIVE Sensitive     CIPROFLOXACIN <=0.25 SENSITIVE Sensitive     GENTAMICIN <=1 SENSITIVE Sensitive     IMIPENEM 0.5 SENSITIVE Sensitive     NITROFURANTOIN 64 INTERMEDIATE Intermediate     TRIMETH/SULFA <=20 SENSITIVE Sensitive     AMPICILLIN/SULBACTAM 4 SENSITIVE Sensitive     PIP/TAZO <=4 SENSITIVE Sensitive     * >=100,000 COLONIES/mL KLEBSIELLA PNEUMONIAE    [x]  Treated with Nitrofurantoin, organism INTERMEDIATE to prescribed antimicrobial  Plan:  1. Discontinue Nitrofurantoin 2. New antibiotic prescription: Fosfomycin 3g orally x 1 dose  ED Provider: Domenic Moras, PA-C   Luiz Ochoa 10/12/2020, 3:17 PM Clinical Pharmacist [phone]

## 2020-10-13 ENCOUNTER — Telehealth: Payer: Self-pay | Admitting: Emergency Medicine

## 2020-10-13 NOTE — Telephone Encounter (Signed)
Spoke with med tech @ [facility] faxed order to [facility] @ [phone]

## 2020-12-08 ENCOUNTER — Inpatient Hospital Stay (HOSPITAL_COMMUNITY)
Admission: EM | Admit: 2020-12-08 | Discharge: 2020-12-16 | DRG: 193 | Disposition: A | Discharge status: Hospice | Payer: Medicare Other | Attending: Family Medicine | Admitting: Family Medicine

## 2020-12-08 ENCOUNTER — Emergency Department (HOSPITAL_COMMUNITY): Payer: Medicare Other

## 2020-12-08 DIAGNOSIS — Z20822 Contact with and (suspected) exposure to covid-19: Secondary | ICD-10-CM | POA: Diagnosis present

## 2020-12-08 DIAGNOSIS — C3490 Malignant neoplasm of unspecified part of unspecified bronchus or lung: Secondary | ICD-10-CM | POA: Diagnosis present

## 2020-12-08 DIAGNOSIS — Z7189 Other specified counseling: Secondary | ICD-10-CM | POA: Diagnosis not present

## 2020-12-08 DIAGNOSIS — R451 Restlessness and agitation: Secondary | ICD-10-CM | POA: Diagnosis not present

## 2020-12-08 DIAGNOSIS — Z515 Encounter for palliative care: Secondary | ICD-10-CM

## 2020-12-08 DIAGNOSIS — J9601 Acute respiratory failure with hypoxia: Secondary | ICD-10-CM | POA: Diagnosis present

## 2020-12-08 DIAGNOSIS — Z880 Allergy status to penicillin: Secondary | ICD-10-CM

## 2020-12-08 DIAGNOSIS — Z66 Do not resuscitate: Secondary | ICD-10-CM | POA: Diagnosis present

## 2020-12-08 DIAGNOSIS — G9341 Metabolic encephalopathy: Secondary | ICD-10-CM | POA: Diagnosis present

## 2020-12-08 DIAGNOSIS — R8271 Bacteriuria: Secondary | ICD-10-CM | POA: Diagnosis present

## 2020-12-08 DIAGNOSIS — F0391 Unspecified dementia with behavioral disturbance: Secondary | ICD-10-CM | POA: Diagnosis not present

## 2020-12-08 DIAGNOSIS — I4891 Unspecified atrial fibrillation: Principal | ICD-10-CM

## 2020-12-08 DIAGNOSIS — R5381 Other malaise: Secondary | ICD-10-CM | POA: Diagnosis not present

## 2020-12-08 DIAGNOSIS — R0602 Shortness of breath: Secondary | ICD-10-CM | POA: Diagnosis not present

## 2020-12-08 DIAGNOSIS — G309 Alzheimer's disease, unspecified: Secondary | ICD-10-CM | POA: Diagnosis present

## 2020-12-08 DIAGNOSIS — R4189 Other symptoms and signs involving cognitive functions and awareness: Secondary | ICD-10-CM | POA: Diagnosis present

## 2020-12-08 DIAGNOSIS — Z79899 Other long term (current) drug therapy: Secondary | ICD-10-CM | POA: Diagnosis not present

## 2020-12-08 DIAGNOSIS — R296 Repeated falls: Secondary | ICD-10-CM | POA: Diagnosis present

## 2020-12-08 DIAGNOSIS — R627 Adult failure to thrive: Secondary | ICD-10-CM | POA: Diagnosis present

## 2020-12-08 DIAGNOSIS — R918 Other nonspecific abnormal finding of lung field: Secondary | ICD-10-CM | POA: Diagnosis not present

## 2020-12-08 DIAGNOSIS — D649 Anemia, unspecified: Secondary | ICD-10-CM | POA: Diagnosis present

## 2020-12-08 DIAGNOSIS — I480 Paroxysmal atrial fibrillation: Secondary | ICD-10-CM | POA: Diagnosis present

## 2020-12-08 DIAGNOSIS — R410 Disorientation, unspecified: Secondary | ICD-10-CM | POA: Diagnosis not present

## 2020-12-08 DIAGNOSIS — R8281 Pyuria: Secondary | ICD-10-CM | POA: Diagnosis present

## 2020-12-08 DIAGNOSIS — J45909 Unspecified asthma, uncomplicated: Secondary | ICD-10-CM | POA: Diagnosis present

## 2020-12-08 DIAGNOSIS — I509 Heart failure, unspecified: Secondary | ICD-10-CM

## 2020-12-08 DIAGNOSIS — E876 Hypokalemia: Secondary | ICD-10-CM | POA: Diagnosis not present

## 2020-12-08 DIAGNOSIS — Z881 Allergy status to other antibiotic agents status: Secondary | ICD-10-CM | POA: Diagnosis not present

## 2020-12-08 DIAGNOSIS — Z886 Allergy status to analgesic agent status: Secondary | ICD-10-CM

## 2020-12-08 DIAGNOSIS — E785 Hyperlipidemia, unspecified: Secondary | ICD-10-CM | POA: Diagnosis present

## 2020-12-08 DIAGNOSIS — K219 Gastro-esophageal reflux disease without esophagitis: Secondary | ICD-10-CM | POA: Diagnosis present

## 2020-12-08 DIAGNOSIS — I5031 Acute diastolic (congestive) heart failure: Secondary | ICD-10-CM | POA: Diagnosis present

## 2020-12-08 DIAGNOSIS — Z888 Allergy status to other drugs, medicaments and biological substances status: Secondary | ICD-10-CM

## 2020-12-08 DIAGNOSIS — Z90710 Acquired absence of both cervix and uterus: Secondary | ICD-10-CM | POA: Diagnosis not present

## 2020-12-08 DIAGNOSIS — Z789 Other specified health status: Secondary | ICD-10-CM | POA: Diagnosis not present

## 2020-12-08 DIAGNOSIS — Z882 Allergy status to sulfonamides status: Secondary | ICD-10-CM

## 2020-12-08 DIAGNOSIS — Z6823 Body mass index (BMI) 23.0-23.9, adult: Secondary | ICD-10-CM

## 2020-12-08 DIAGNOSIS — F10231 Alcohol dependence with withdrawal delirium: Secondary | ICD-10-CM | POA: Diagnosis not present

## 2020-12-08 DIAGNOSIS — J189 Pneumonia, unspecified organism: Secondary | ICD-10-CM | POA: Diagnosis present

## 2020-12-08 LAB — URINALYSIS, ROUTINE W REFLEX MICROSCOPIC
Bilirubin Urine: NEGATIVE
Glucose, UA: NEGATIVE mg/dL
Hgb urine dipstick: NEGATIVE
Ketones, ur: NEGATIVE mg/dL
Nitrite: NEGATIVE
Protein, ur: NEGATIVE mg/dL
Specific Gravity, Urine: 1.009 (ref 1.005–1.030)
pH: 5 (ref 5.0–8.0)

## 2020-12-08 LAB — CBC
HCT: 35.4 % — ABNORMAL LOW (ref 36.0–46.0)
Hemoglobin: 10.8 g/dL — ABNORMAL LOW (ref 12.0–15.0)
MCH: 25.4 pg — ABNORMAL LOW (ref 26.0–34.0)
MCHC: 30.5 g/dL (ref 30.0–36.0)
MCV: 83.3 fL (ref 80.0–100.0)
Platelets: 435 10*3/uL — ABNORMAL HIGH (ref 150–400)
RBC: 4.25 MIL/uL (ref 3.87–5.11)
RDW: 16.4 % — ABNORMAL HIGH (ref 11.5–15.5)
WBC: 9.2 10*3/uL (ref 4.0–10.5)
nRBC: 0 % (ref 0.0–0.2)

## 2020-12-08 LAB — COMPREHENSIVE METABOLIC PANEL
ALT: 17 U/L (ref 0–44)
AST: 21 U/L (ref 15–41)
Albumin: 2.7 g/dL — ABNORMAL LOW (ref 3.5–5.0)
Alkaline Phosphatase: 95 U/L (ref 38–126)
Anion gap: 11 (ref 5–15)
BUN: 13 mg/dL (ref 8–23)
CO2: 26 mmol/L (ref 22–32)
Calcium: 8.9 mg/dL (ref 8.9–10.3)
Chloride: 100 mmol/L (ref 98–111)
Creatinine, Ser: 0.63 mg/dL (ref 0.44–1.00)
GFR, Estimated: >60 mL/min (ref >60)
Glucose, Bld: 103 mg/dL — ABNORMAL HIGH (ref 70–99)
Potassium: 3.9 mmol/L (ref 3.5–5.1)
Sodium: 137 mmol/L (ref 135–145)
Total Bilirubin: 0.5 mg/dL (ref 0.3–1.2)
Total Protein: 6.7 g/dL (ref 6.5–8.1)

## 2020-12-08 LAB — TROPONIN I (HIGH SENSITIVITY)
Troponin I (High Sensitivity): 13 ng/L (ref <18)
Troponin I (High Sensitivity): 12 ng/L (ref <18)

## 2020-12-08 LAB — BRAIN NATRIURETIC PEPTIDE: B Natriuretic Peptide: 306.6 pg/mL — ABNORMAL HIGH (ref 0.0–100.0)

## 2020-12-08 LAB — RESP PANEL BY RT-PCR (FLU A&B, COVID) ARPGX2
Influenza A by PCR: NEGATIVE
Influenza B by PCR: NEGATIVE
SARS Coronavirus 2 by RT PCR: NEGATIVE

## 2020-12-08 LAB — HIV ANTIBODY (ROUTINE TESTING W REFLEX): HIV Screen 4th Generation wRfx: NONREACTIVE

## 2020-12-08 LAB — PROCALCITONIN: Procalcitonin: <0.1 ng/mL

## 2020-12-08 LAB — PROTIME-INR
INR: 1.2 (ref 0.8–1.2)
Prothrombin Time: 14.8 seconds (ref 11.4–15.2)

## 2020-12-08 LAB — LACTIC ACID, PLASMA: Lactic Acid, Venous: 1.5 mmol/L (ref 0.5–1.9)

## 2020-12-08 MED ORDER — HALOPERIDOL LACTATE 5 MG/ML IJ SOLN
2.0000 mg | Freq: Four times a day (QID) | INTRAMUSCULAR | Status: DC | PRN
  Administered 2020-12-08: 2 mg via INTRAVENOUS
  Administered 2020-12-09: 4 mg via INTRAVENOUS
  Filled 2020-12-08 (×2): qty 1

## 2020-12-08 MED ORDER — LORATADINE 10 MG PO TABS
10.0000 mg | ORAL_TABLET | Freq: Every day | ORAL | Status: DC
  Administered 2020-12-10: 10 mg via ORAL
  Filled 2020-12-08 (×2): qty 1

## 2020-12-08 MED ORDER — HALOPERIDOL LACTATE 5 MG/ML IJ SOLN: 2.0000 mg | Freq: Once | INTRAMUSCULAR | Status: AC

## 2020-12-08 MED ORDER — VANCOMYCIN HCL 1500 MG/300ML IV SOLN
1500.0000 mg | Freq: Once | INTRAVENOUS | Status: AC
  Administered 2020-12-08: 1500 mg via INTRAVENOUS
  Filled 2020-12-08: qty 300

## 2020-12-08 MED ORDER — ENOXAPARIN SODIUM 40 MG/0.4ML ~~LOC~~ SOLN
40.0000 mg | SUBCUTANEOUS | Status: DC
  Administered 2020-12-08 – 2020-12-09 (×2): 40 mg via SUBCUTANEOUS
  Filled 2020-12-08 (×2): qty 0.4

## 2020-12-08 MED ORDER — SODIUM CHLORIDE 0.9 % IV SOLN
2.0000 g | Freq: Once | INTRAVENOUS | Status: AC
  Administered 2020-12-08: 2 g via INTRAVENOUS
  Filled 2020-12-08: qty 2

## 2020-12-08 MED ORDER — BUDESONIDE 0.25 MG/2ML IN SUSP
0.2500 mg | Freq: Two times a day (BID) | RESPIRATORY_TRACT | Status: DC
  Administered 2020-12-10 – 2020-12-13 (×7): 0.25 mg via RESPIRATORY_TRACT
  Filled 2020-12-08 (×10): qty 2

## 2020-12-08 MED ORDER — SERTRALINE HCL 100 MG PO TABS
100.0000 mg | ORAL_TABLET | Freq: Every day | ORAL | Status: DC
Start: 2020-12-09 — End: 2020-12-10
  Administered 2020-12-10: 100 mg via ORAL
  Filled 2020-12-08 (×2): qty 1

## 2020-12-08 MED ORDER — ACETAMINOPHEN 500 MG PO TABS
500.0000 mg | ORAL_TABLET | Freq: Four times a day (QID) | ORAL | Status: DC | PRN
Start: 2020-12-08 — End: 2020-12-17

## 2020-12-08 MED ORDER — ALBUTEROL SULFATE HFA 108 (90 BASE) MCG/ACT IN AERS
2.0000 | INHALATION_SPRAY | Freq: Four times a day (QID) | RESPIRATORY_TRACT | Status: DC | PRN
  Filled 2020-12-08: qty 6.7

## 2020-12-08 MED ORDER — VITAMIN D 25 MCG (1000 UNIT) PO TABS
1000.0000 [IU] | ORAL_TABLET | Freq: Every day | ORAL | Status: DC
  Administered 2020-12-10: 1000 [IU] via ORAL
  Filled 2020-12-08 (×2): qty 1

## 2020-12-08 MED ORDER — MELATONIN 3 MG PO TABS
3.0000 mg | ORAL_TABLET | Freq: Every day | ORAL | Status: DC
  Filled 2020-12-08 (×2): qty 1

## 2020-12-08 MED ORDER — SODIUM CHLORIDE 0.9 % IV SOLN
100.0000 mg | Freq: Two times a day (BID) | INTRAVENOUS | Status: DC
  Administered 2020-12-08 – 2020-12-10 (×4): 100 mg via INTRAVENOUS
  Filled 2020-12-08 (×5): qty 100

## 2020-12-08 MED ORDER — DILTIAZEM HCL-DEXTROSE 125-5 MG/125ML-% IV SOLN (PREMIX)
5.0000 mg/h | INTRAVENOUS | Status: DC
  Administered 2020-12-08: 5 mg/h via INTRAVENOUS
  Filled 2020-12-08: qty 125

## 2020-12-08 MED ORDER — VANCOMYCIN HCL 1750 MG/350ML IV SOLN: 1750.0000 mg | INTRAVENOUS | Status: DC

## 2020-12-08 MED ORDER — HALOPERIDOL LACTATE 5 MG/ML IJ SOLN
INTRAMUSCULAR | Status: AC
  Administered 2020-12-08: 2 mg via INTRAMUSCULAR
  Filled 2020-12-08: qty 1

## 2020-12-08 MED ORDER — SENNOSIDES-DOCUSATE SODIUM 8.6-50 MG PO TABS
1.0000 | ORAL_TABLET | Freq: Two times a day (BID) | ORAL | Status: DC
  Administered 2020-12-10 – 2020-12-12 (×2): 1 via ORAL
  Filled 2020-12-08 (×3): qty 1

## 2020-12-08 MED ORDER — ATORVASTATIN CALCIUM 10 MG PO TABS: 10.0000 mg | ORAL_TABLET | Freq: Every day | ORAL | Status: DC

## 2020-12-08 MED ORDER — OLANZAPINE 5 MG PO TABS
7.5000 mg | ORAL_TABLET | Freq: Every day | ORAL | Status: DC
  Filled 2020-12-08 (×6): qty 1

## 2020-12-08 MED ORDER — HALOPERIDOL LACTATE 5 MG/ML IJ SOLN: 2.0000 mg | Freq: Four times a day (QID) | INTRAMUSCULAR | Status: DC | PRN

## 2020-12-08 MED ORDER — FUROSEMIDE 10 MG/ML IJ SOLN
40.0000 mg | Freq: Once | INTRAMUSCULAR | Status: AC
  Administered 2020-12-08: 40 mg via INTRAVENOUS
  Filled 2020-12-08: qty 4

## 2020-12-08 MED ORDER — SODIUM CHLORIDE 0.9 % IV SOLN
2.0000 g | INTRAVENOUS | Status: DC
  Administered 2020-12-09 – 2020-12-10 (×2): 2 g via INTRAVENOUS
  Filled 2020-12-08 (×3): qty 20

## 2020-12-08 NOTE — Progress Notes (Addendum)
Pharmacy Antibiotic Note  Brandy Wright is a 87 y.o. female admitted on 12/08/2020 with pneumonia.  Patient presents from [facility] where she was diagnosed with pneumonia last Friday.  Pharmacy has been consulted for vancomycin dosing. Tm 97.8. WBC 9.2. Last weight 72kg in 10/2019- patient unable to confirm weight.  Est CrCl ~40 mL/min  Plan: Vancomycin 1500mg  IV x1, followed by Vancomycin 1750mg  IV q48 hours (Pred AUC 496 using Scr 1) Cefepime 2g IV x1 F/u clinical improvement, vanc levels as indicated      Temp (24hrs), Avg:97.8 F (36.6 C), Min:97.8 F (36.6 C), Max:97.8 F (36.6 C)  No results for input(s): WBC, CREATININE, LATICACIDVEN, VANCOTROUGH, VANCOPEAK, VANCORANDOM, GENTTROUGH, GENTPEAK, GENTRANDOM, TOBRATROUGH, TOBRAPEAK, TOBRARND, AMIKACINPEAK, AMIKACINTROU, AMIKACIN in the last 168 hours.  CrCl cannot be calculated (Patient's most recent lab result is older than the maximum 21 days allowed.).    Allergies  Allergen Reactions  . Cephalexin Itching    Tolerates cefazolin/ceftriaxone  Tol cefazolin (2017) and ceftriaxone (2020) w/o problem    . Penicillins Other (See Comments)    Unknown reaction but tolerates ceftriaxone/cefazolin    . Sulfamethoxazole-Trimethoprim     Other reaction(s): Other (See Comments), Unknown  . Aspirin   . Atrovent [Ipratropium]     Dizzy when tried  . Qnasl [Beclomethasone]     Nasal burning.   . Zithromax [Azithromycin]   . Ciprofloxacin Diarrhea  . Pseudoephedrine Hcl Palpitations    Antimicrobials this admission: Vancomycin 2/21 >>  Cefepime 2/21 >>    Microbiology results: 2/21 BCx: ordered  Thank you for allowing pharmacy to be a part of this patient's care.  Dimple Nanas, PharmD PGY-1 Acute Care Pharmacy Resident Office: [phone] 12/08/2020 3:56 PM

## 2020-12-08 NOTE — ED Notes (Signed)
Pt became very agitated and confused, pulling at lines, removing O2 Pope, trying to get out of bed and cussing at staff. Pt unable to be redirected or comforted. Dr Ralene Bathe notified and came to bedside to assist. Haldol ordered and given. Pt is resting comfortably at this time. IV accesses are secured with kerlex. Vanc and cardizem infusing at this time.

## 2020-12-08 NOTE — ED Triage Notes (Signed)
Pt arrives from [facility] she was diagnosed with pneumonia on Friday after a chest xray. Per ems she had o2 sats around 80% with ems was 91% on room air however tachycardic rate of 160.

## 2020-12-08 NOTE — H&P (Signed)
History and Physical    Brandy Wright EGB:[id number] DOB: 1933/03/19 DOA: 12/08/2020  PCP: Patient, No Pcp Per  Patient coming from: SNF  I have personally briefly reviewed patient's old medical records in [place]  Chief Complaint: PNA  HPI: Brandy Wright is a 87 y.o. female with medical history significant of asthma, dementia, frequent falls.  Pt presents to ED with SOB, confusion.  Pt reportedly diagnosed with PNA at SNF on Friday.  Today per EMS had O2 sats around 80% and tachycardic to 160s.  Initially pt denied complaints other than BLE edema which is reportedly new.   ED Course: confusion turned into agitated delirium while in ED.  Satting upper 80s on RA, improved on O2.  A.Fib RVR, now rate controlled on cardizem gtt.  CXR with likely PNA -> EDP started cefepime + Vanc.  WBC nl, no fever.  BNP 306.6  Trop neg x2, lactate nl.  EDP also gave 40mg  IV lasix for suspected CHF.   Review of Systems: Unable to perform as pt has agitated delirium at this time.  Past Medical History:  Diagnosis Date  . Allergic arthritis   . Asthma   . Cataract    bilateral  . GERD (gastroesophageal reflux disease)   . Osteoarthritis     Past Surgical History:  Procedure Laterality Date  . ABDOMINAL HYSTERECTOMY    . BREAST SURGERY     2 biopsy  . BRONCHOSCOPY    . NASAL SINUS SURGERY       reports that she has never smoked. She has never used smokeless tobacco. She reports that she does not drink alcohol and does not use drugs.  Allergies  Allergen Reactions  . Cephalexin Itching and Other (See Comments)    Tolerated cefazolin (2017) and ceftriaxone (2020) w/o problem- "Allergic," per MAR  . Penicillins Other (See Comments)    "Allergic," per MAR   . Sulfamethoxazole-Trimethoprim Other (See Comments)    "Allergic," per MAR  . Aspirin Other (See Comments)    "Allergic," per MAR  . Atrovent [Ipratropium] Other (See Comments)    Became dizzy,  when tried-  "allergic," per MAR  . Qnasl [Beclomethasone] Other (See Comments)    Nasal burning- "allergic," per MAR  . Zithromax [Azithromycin] Other (See Comments)    "Allergic," per MAR  . Ciprofloxacin Diarrhea and Other (See Comments)    "Allergic," per MAR  . Pseudoephedrine Hcl Palpitations and Other (See Comments)    "allergic," per [facility]    Family History  Problem Relation Age of Onset  . Alzheimer's disease Mother   . Osteoarthritis Mother   . Cancer Mother        colon  . Cancer Father        colon  . Multiple sclerosis Brother   . Myasthenia gravis Brother   . Myasthenia gravis Brother      Prior to Admission medications   Medication Sig Start Date End Date Taking? Authorizing Provider  acetaminophen (TYLENOL) 500 MG tablet Take 500 mg by mouth every 6 (six) hours as needed (for a fever of 99.5-101 F or minor discomfort).   Yes [provider]  albuterol (PROVENTIL HFA;VENTOLIN HFA) 108 (90 Base) MCG/ACT inhaler Inhale 2 puffs into the lungs every 6 (six) hours as needed for wheezing or shortness of breath. 06/09/16  Yes Breeback, Jade L, PA-C  atorvastatin (LIPITOR) 10 MG tablet Take 10 mg by mouth at bedtime.   Yes [provider]  cetirizine (ZYRTEC) 10 MG  tablet Take 10 mg by mouth daily.   Yes [provider]  Cholecalciferol (VITAMIN D-3) 25 MCG (1000 UT) CAPS Take 1,000 Units by mouth daily.   Yes [provider]  fluticasone (FLOVENT HFA) 220 MCG/ACT inhaler Inhale 1 puff into the lungs daily.   Yes [provider]  loperamide (IMODIUM) 2 MG capsule Take 2 mg by mouth as needed (with each loose stool/diarrhea- not to exceed 8 doses/24 hours).   Yes [provider]  magnesium hydroxide (MILK OF MAGNESIA) 400 MG/5ML suspension Take 30 mLs by mouth at bedtime as needed for mild constipation (and contact MD if symptoms persist longer than 24 hours).   Yes [provider]  melatonin 3 MG TABS tablet Take 3 mg by mouth  at bedtime.   Yes [provider]  MI-ACID 200-200-20 MG/5ML suspension Take 30 mLs by mouth every 6 (six) hours as needed for indigestion or heartburn (and call MD if symptoms last longer than 24 hours).   Yes [provider]  neomycin-bacitracin-polymyxin (NEOSPORIN) [phone] ointment Apply 1 application topically See admin instructions. Apply to affected area as needed for minor skin tears or abrasions- cover with a Band-Aid or gauze/tape- change daily, up to 7 days   Yes [provider]  OLANZapine (ZYPREXA) 7.5 MG tablet Take 7.5 mg by mouth at bedtime.   Yes [provider]  ROBAFEN 100 MG/5ML syrup Take 200 mg by mouth every 6 (six) hours as needed for cough.   Yes [provider]  sennosides-docusate sodium (SENOKOT-S) 8.6-50 MG tablet Take 2 tablets by mouth in the morning and at bedtime.   Yes [provider]  sertraline (ZOLOFT) 100 MG tablet Take 100 mg by mouth daily.   Yes [provider]    Physical Exam: Vitals:   12/08/20 1815 12/08/20 1830 12/08/20 1900 12/08/20 1915  BP: 110/63 121/67 108/67 103/89  Pulse: (!) 103 95 83 81  Resp: 15 (!) 26 (!) 31 18  Temp:      TempSrc:      SpO2: (!) 79% (!) 89% 100% 100%    Constitutional: Ill appearing, confused Eyes: PERRL, lids and conjunctivae normal ENMT: Mucous membranes are moist. Posterior pharynx clear of any exudate or lesions.Normal dentition.  Neck: normal, supple, no masses, no thyromegaly Respiratory: decreased air movement Cardiovascular: RRR, no murmurs / rubs / gallops. 2+ extremity edema. 2+ pedal pulses. No carotid bruits.  Abdomen: no tenderness, no masses palpated. No hepatosplenomegaly. Bowel sounds positive.  Musculoskeletal: no clubbing / cyanosis. No joint deformity upper and lower extremities. Good ROM, no contractures. Normal muscle tone.  Skin: no rashes, lesions, ulcers. No induration Neurologic: MAE Psychiatric: Confused, mildly sedated  from haldol   Labs on Admission: I have personally reviewed following labs and imaging studies  CBC: Recent Labs  Lab 12/08/20 1503  WBC 9.2  HGB 10.8*  HCT 35.4*  MCV 83.3  PLT 423*   Basic Metabolic Panel: Recent Labs  Lab 12/08/20 1523  NA 137  K 3.9  CL 100  CO2 26  GLUCOSE 103*  BUN 13  CREATININE 0.63  CALCIUM 8.9   GFR: CrCl cannot be calculated (Unknown ideal weight.). Liver Function Tests: Recent Labs  Lab 12/08/20 1523  AST 21  ALT 17  ALKPHOS 95  BILITOT 0.5  PROT 6.7  ALBUMIN 2.7*   No results for input(s): LIPASE, AMYLASE in the last 168 hours. No results for input(s): AMMONIA in the last 168 hours. Coagulation Profile: Recent  Labs  Lab 12/08/20 1523  INR 1.2   Cardiac Enzymes: No results for input(s): CKTOTAL, CKMB, CKMBINDEX, TROPONINI in the last 168 hours. BNP (last 3 results) No results for input(s): PROBNP in the last 8760 hours. HbA1C: No results for input(s): HGBA1C in the last 72 hours. CBG: No results for input(s): GLUCAP in the last 168 hours. Lipid Profile: No results for input(s): CHOL, HDL, LDLCALC, TRIG, CHOLHDL, LDLDIRECT in the last 72 hours. Thyroid Function Tests: No results for input(s): TSH, T4TOTAL, FREET4, T3FREE, THYROIDAB in the last 72 hours. Anemia Panel: No results for input(s): VITAMINB12, FOLATE, FERRITIN, TIBC, IRON, RETICCTPCT in the last 72 hours. Urine analysis:    Component Value Date/Time   COLORURINE YELLOW 10/08/2020 0855   APPEARANCEUR HAZY (A) 10/08/2020 0855   LABSPEC 1.016 10/08/2020 0855   PHURINE 5.0 10/08/2020 0855   GLUCOSEU NEGATIVE 10/08/2020 0855   HGBUR NEGATIVE 10/08/2020 0855   BILIRUBINUR NEGATIVE 10/08/2020 0855   KETONESUR NEGATIVE 10/08/2020 0855   PROTEINUR NEGATIVE 10/08/2020 0855   NITRITE POSITIVE (A) 10/08/2020 0855   LEUKOCYTESUR MODERATE (A) 10/08/2020 0855    Radiological Exams on Admission: DG Chest Port 1 View  Result Date: 12/08/2020 CLINICAL DATA:  Short  of breath EXAM: PORTABLE CHEST 1 VIEW COMPARISON:  04/26/2020, CT chest 10/15/2019 FINDINGS: Volume loss left hemithorax with shift of mediastinal contents to the left. Increased left pleural disease or thickening. Redemonstrated left perihilar opacity with new or worsened consolidation at the left base. Cardiomediastinal silhouette largely obscured. Aortic atherosclerosis. Patchy opacities in the right thorax. IMPRESSION: 1. Worsening aeration with new or worsened consolidation at the left base which may be due to pneumonia or postobstructive change. 2. Increased left pleural disease/pleural thickening with shift of mediastinal contents to the left. Persistent left perihilar opacity. 3. Patchy opacities in the right thorax may reflect diffuse foci of infection Electronically Signed   By: Donavan Foil M.D.   On: 12/08/2020 15:51    EKG: Independently reviewed.  Assessment/Plan Principal Problem:   Acute respiratory failure with hypoxia (HCC) Active Problems:   Cognitive changes   Acute metabolic encephalopathy   Atrial fibrillation with RVR (HCC)    1. Acute resp failure with hypoxia - 1. Due to PNA and/or new onset CHF in setting of A.Fib RVR 2. Cont pulse ox 3. O2 via [place] 2. Suspected PNA - 1. Cefepime + Vanc in ED 2. PNA pathway 3. Rocephin, doxy, vanc for the moment 4. MRSA PCR nares pending 5. Getting CT chest w contrast to try and differentiate between PNA / CHF / ? parapneumonic effusion on that L side 6. Procalcitonin ordered and pending 7. Cultures 8. RVP 9. Repeat CBC/BMP in AM 3. A.Fib RVR - 1. Rate control with cardizem gtt 1. Actually will stop cardizem gtt for the moment as she has converted back to NSR. 2. Tele monitor 3. 2d echo (also to help with possible new onset CHF diagnosis) 1. Got 40mg  IV lasix in ED 2. Strict intake and output 4. Dont feel shes a good candidate for long term anticoagulation at this point: 1. Pt had 3 ED visits in Nov and Dec 21 for  falls. 2. Including at least 2 (possibly 3) of them with documented head trauma! 4. Agitated delirium on top of chronic dementia - 1. Acute component due to above 2. Haldol PRN 3. Cont home zyprexa and other meds, when/if able to take POs  DVT prophylaxis: Lovenox Code Status: DNR - confirmed with family at bedside Family  Communication: Family at bedside Disposition Plan: SNF after admit Consults called: None Admission status: Admit to inpatient  Severity of Illness: The appropriate patient status for this patient is INPATIENT. Inpatient status is judged to be reasonable and necessary in order to provide the required intensity of service to ensure the patient's safety. The patient's presenting symptoms, physical exam findings, and initial radiographic and laboratory data in the context of their chronic comorbidities is felt to place them at high risk for further clinical deterioration. Furthermore, it is not anticipated that the patient will be medically stable for discharge from the hospital within 2 midnights of admission. The following factors support the patient status of inpatient.   IP status due to acute hypoxic resp failure - either from PNA and/or new onset of CHF in setting of new onset A.Fib RVR.   * I certify that at the point of admission it is my clinical judgment that the patient will require inpatient hospital care spanning beyond 2 midnights from the point of admission due to high intensity of service, high risk for further deterioration and high frequency of surveillance required.*    GARDNER, JARED M. DO [facility]  How to contact the [facility] Attending or Consulting provider [place] or covering provider during after hours [place], for this patient?  1. Check the care team in [facility] and look for a) attending/consulting [facility] provider listed and b) the [facility] team listed 2. Log into [email or web address]  Amion Physician Scheduling and messaging for groups and whole hospitals  On  call and physician scheduling software for group practices, residents, hospitalists and other medical providers for call, clinic, rotation and shift schedules. On[employer] is a hospital-wide system for scheduling doctors and paging doctors on call. EasyPlot is for scientific plotting and data analysis.  [email or web address]  and use [place]'s universal password to access. If you do not have the password, please contact the hospital operator.  3. Locate the [facility] provider you are looking for under [facility] and page to a number that you can be directly reached. 4. If you still have difficulty reaching the provider, please page the [facility] (Director on Call) for the Hospitalists listed on amion for assistance.  12/08/2020, 7:53 PM

## 2020-12-08 NOTE — ED Notes (Signed)
Pt is not trying to remove leads and lines again. Pt placed in mittens but pt is trying to take mittens off. Pt not redirectable. Pt is visible from nurses desk but is requiring constant monitoring.

## 2020-12-08 NOTE — ED Notes (Signed)
DAUG. [place]  [phone]

## 2020-12-08 NOTE — ED Provider Notes (Signed)
[facility] Provider Note   CSN: [id number] Arrival date & time: 12/08/20  1450     History Chief Complaint  Patient presents with  . Pneumonia    [facility] Wright is a 87 y.o. female.  The history is provided by the patient and medical records.  Pneumonia   Brandy Wright is a 87 y.o. female who presents to the Emergency Department complaining of difficulty breathing. Level V caveat due to confusion. History is provided by EMS. She presents the emergency department from [facility]. She was diagnosed with pneumonia on Friday after chest x-ray. Per EMS she had oxygen sats around 80% with EMS and tachycardic to the 160s. On ED presentation patient denies any complaints. She does report lower extremity edema, which is new.   Emergency contact is Standley Brooking. [place]    Past Medical History:  Diagnosis Date  . Allergic arthritis   . Asthma   . Cataract    bilateral  . GERD (gastroesophageal reflux disease)   . Osteoarthritis     Patient Active Problem List   Diagnosis Date Noted  . Acute metabolic encephalopathy 26/94/8546  . Atrial fibrillation with RVR ([place]) 12/08/2020  . Acute respiratory failure with hypoxia ([place]) 12/08/2020  . Nasal congestion 06/11/2016  . Asthma, mild intermittent 06/09/2016  . Chest tightness 06/09/2016  . Cognitive changes 06/09/2016  . Cataracts, bilateral 07/10/2014  . Asthma, chronic 07/10/2014  . GERD (gastroesophageal reflux disease) 07/10/2014  . Palpitations 07/10/2014  . Tachycardia 07/10/2014    Past Surgical History:  Procedure Laterality Date  . ABDOMINAL HYSTERECTOMY    . BREAST SURGERY     2 biopsy  . BRONCHOSCOPY    . NASAL SINUS SURGERY       OB History   No obstetric history on file.     Family History  Problem Relation Age of Onset  . Alzheimer's disease Mother   . Osteoarthritis Mother   . Cancer Mother        colon  . Cancer Father        colon  .  Multiple sclerosis Brother   . Myasthenia gravis Brother   . Myasthenia gravis Brother     Social History   Tobacco Use  . Smoking status: Never Smoker  . Smokeless tobacco: Never Used  Substance Use Topics  . Alcohol use: No  . Drug use: No    Home Medications Prior to Admission medications   Medication Sig Start Date End Date Taking? Authorizing Provider  acetaminophen (TYLENOL) 500 MG tablet Take 500 mg by mouth every 6 (six) hours as needed (for a fever of 99.5-101 F or minor discomfort).   Yes [provider]  albuterol (PROVENTIL HFA;VENTOLIN HFA) 108 (90 Base) MCG/ACT inhaler Inhale 2 puffs into the lungs every 6 (six) hours as needed for wheezing or shortness of breath. 06/09/16  Yes Breeback, Jade L, PA-C  atorvastatin (LIPITOR) 10 MG tablet Take 10 mg by mouth at bedtime.   Yes [provider]  cetirizine (ZYRTEC) 10 MG tablet Take 10 mg by mouth daily.   Yes [provider]  Cholecalciferol (VITAMIN D-3) 25 MCG (1000 UT) CAPS Take 1,000 Units by mouth daily.   Yes [provider]  fluticasone (FLOVENT HFA) 220 MCG/ACT inhaler Inhale 1 puff into the lungs daily.   Yes [provider]  loperamide (IMODIUM) 2 MG capsule Take 2 mg by mouth as needed (with each loose stool/diarrhea- not to  exceed 8 doses/24 hours).   Yes [provider]  magnesium hydroxide (MILK OF MAGNESIA) 400 MG/5ML suspension Take 30 mLs by mouth at bedtime as needed for mild constipation (and contact MD if symptoms persist longer than 24 hours).   Yes [provider]  melatonin 3 MG TABS tablet Take 3 mg by mouth at bedtime.   Yes [provider]  MI-ACID 200-200-20 MG/5ML suspension Take 30 mLs by mouth every 6 (six) hours as needed for indigestion or heartburn (and call MD if symptoms last longer than 24 hours).   Yes [provider]  neomycin-bacitracin-polymyxin (NEOSPORIN) [phone] ointment Apply 1 application topically See  admin instructions. Apply to affected area as needed for minor skin tears or abrasions- cover with a Band-Aid or gauze/tape- change daily, up to 7 days   Yes [provider]  OLANZapine (ZYPREXA) 7.5 MG tablet Take 7.5 mg by mouth at bedtime.   Yes [provider]  ROBAFEN 100 MG/5ML syrup Take 200 mg by mouth every 6 (six) hours as needed for cough.   Yes [provider]  sennosides-docusate sodium (SENOKOT-S) 8.6-50 MG tablet Take 2 tablets by mouth in the morning and at bedtime.   Yes [provider]  sertraline (ZOLOFT) 100 MG tablet Take 100 mg by mouth daily.   Yes [provider]    Allergies    Cephalexin, Penicillins, Sulfamethoxazole-trimethoprim, Aspirin, Atrovent [ipratropium], Qnasl [beclomethasone], Zithromax [azithromycin], Ciprofloxacin, and Pseudoephedrine hcl  Review of Systems   Review of Systems  All other systems reviewed and are negative.   Physical Exam Updated Vital Signs BP (!) 96/51   Pulse 62   Temp 98.6 F (37 C) (Rectal)   Resp 15   SpO2 99%   Physical Exam Vitals and nursing note reviewed.  Constitutional:      General: She is in acute distress.     Appearance: She is well-developed and well-nourished. She is ill-appearing.  HENT:     Head: Normocephalic and atraumatic.  Cardiovascular:     Rate and Rhythm: Tachycardia present. Rhythm irregular.     Heart sounds: No murmur heard.   Pulmonary:     Effort: Pulmonary effort is normal. No respiratory distress.     Comments: Decreased air movement bilaterally, left greater than right Abdominal:     Palpations: Abdomen is soft.     Tenderness: There is no abdominal tenderness. There is no guarding or rebound.  Musculoskeletal:        General: Swelling present. No tenderness or edema.     Comments: 2+ to 3+ pitting edema to BLE  Skin:    General: Skin is warm and dry.  Neurological:     Mental Status: She is alert.     Comments: Hard of hearing.   Confused.  Disoriented to time and recent events.   Psychiatric:        Mood and Affect: Mood and affect normal.        Behavior: Behavior normal.     ED Results / Procedures / Treatments   Labs (all labs ordered are listed, but only abnormal results are displayed) Labs Reviewed  CBC - Abnormal; Notable for the following components:      Result Value   Hemoglobin 10.8 (*)    HCT 35.4 (*)    MCH 25.4 (*)    RDW 16.4 (*)    Platelets 435 (*)    All other components within normal limits  COMPREHENSIVE METABOLIC PANEL - Abnormal; Notable  for the following components:   Glucose, Bld 103 (*)    Albumin 2.7 (*)    All other components within normal limits  BRAIN NATRIURETIC PEPTIDE - Abnormal; Notable for the following components:   B Natriuretic Peptide 306.6 (*)    All other components within normal limits  URINALYSIS, ROUTINE W REFLEX MICROSCOPIC - Abnormal; Notable for the following components:   Leukocytes,Ua LARGE (*)    Bacteria, UA RARE (*)    All other components within normal limits  RESP PANEL BY RT-PCR (FLU A&B, COVID) ARPGX2  CULTURE, BLOOD (ROUTINE X 2)  CULTURE, BLOOD (ROUTINE X 2)  MRSA PCR SCREENING  RESPIRATORY PANEL BY PCR  URINE CULTURE  LACTIC ACID, PLASMA  PROTIME-INR  PROCALCITONIN  HIV ANTIBODY (ROUTINE TESTING W REFLEX)  CBC  BASIC METABOLIC PANEL  TROPONIN I (HIGH SENSITIVITY)  TROPONIN I (HIGH SENSITIVITY)    EKG EKG Interpretation  Date/Time:  Monday December 08 2020 15:12:13 EST Ventricular Rate:  126 PR Interval:    QRS Duration: 66 QT Interval:  287 QTC Calculation: 416 R Axis:   101 Text Interpretation: Atrial fibrillation Ventricular premature complex Right axis deviation Repolarization abnormality, prob rate related Confirmed by Quintella Reichert [phone]) on 12/08/2020 3:34:53 PM   Radiology DG Chest Port 1 View  Result Date: 12/08/2020 CLINICAL DATA:  Short of breath EXAM: PORTABLE CHEST 1 VIEW COMPARISON:  04/26/2020, CT chest  10/15/2019 FINDINGS: Volume loss left hemithorax with shift of mediastinal contents to the left. Increased left pleural disease or thickening. Redemonstrated left perihilar opacity with new or worsened consolidation at the left base. Cardiomediastinal silhouette largely obscured. Aortic atherosclerosis. Patchy opacities in the right thorax. IMPRESSION: 1. Worsening aeration with new or worsened consolidation at the left base which may be due to pneumonia or postobstructive change. 2. Increased left pleural disease/pleural thickening with shift of mediastinal contents to the left. Persistent left perihilar opacity. 3. Patchy opacities in the right thorax may reflect diffuse foci of infection Electronically Signed   By: Donavan Foil M.D.   On: 12/08/2020 15:51    Procedures Procedures  CRITICAL CARE Performed by: Quintella Reichert   Total critical care time: 40 minutes  Critical care time was exclusive of separately billable procedures and treating other patients.  Critical care was necessary to treat or prevent imminent or life-threatening deterioration.  Critical care was time spent personally by me on the following activities: development of treatment plan with patient and/or surrogate as well as nursing, discussions with consultants, evaluation of patient's response to treatment, examination of patient, obtaining history from patient or surrogate, ordering and performing treatments and interventions, ordering and review of laboratory studies, ordering and review of radiographic studies, pulse oximetry and re-evaluation of patient's condition.  Medications Ordered in ED Medications  vancomycin (VANCOREADY) IVPB 1750 mg/350 mL (has no administration in time range)  haloperidol lactate (HALDOL) injection 2-4 mg (2 mg Intravenous Given 12/08/20 1955)  melatonin tablet 3 mg (has no administration in time range)  OLANZapine (ZYPREXA) tablet 7.5 mg (has no administration in time range)  senna-docusate  (Senokot-S) tablet 1 tablet (has no administration in time range)  sertraline (ZOLOFT) tablet 100 mg (has no administration in time range)  budesonide (PULMICORT) nebulizer solution 0.25 mg (has no administration in time range)  atorvastatin (LIPITOR) tablet 10 mg (has no administration in time range)  albuterol (VENTOLIN HFA) 108 (90 Base) MCG/ACT inhaler 2 puff (has no administration in time range)  loratadine (CLARITIN) tablet 10 mg (has no administration  in time range)  cholecalciferol (VITAMIN D3) tablet 1,000 Units (has no administration in time range)  acetaminophen (TYLENOL) tablet 500 mg (has no administration in time range)  cefTRIAXone (ROCEPHIN) 2 g in sodium chloride 0.9 % 100 mL IVPB (has no administration in time range)  doxycycline (VIBRAMYCIN) 100 mg in sodium chloride 0.9 % 250 mL IVPB (100 mg Intravenous New Bag/Given 12/08/20 2312)  enoxaparin (LOVENOX) injection 40 mg (40 mg Subcutaneous Given 12/08/20 2312)  ceFEPIme (MAXIPIME) 2 g in sodium chloride 0.9 % 100 mL IVPB (0 g Intravenous Stopped 12/08/20 1703)  vancomycin (VANCOREADY) IVPB 1500 mg/300 mL (0 mg Intravenous Stopped 12/08/20 1906)  furosemide (LASIX) injection 40 mg (40 mg Intravenous Given 12/08/20 1718)  haloperidol lactate (HALDOL) injection 2 mg (2 mg Intramuscular Given 12/08/20 1802)    ED Course  I have reviewed the triage vital signs and the nursing notes.  Pertinent labs & imaging results that were available during my care of the patient were reviewed by me and considered in my medical decision making (see chart for details).    MDM Rules/Calculators/A&P                         patient here for evaluation of shortness of breath, recently diagnosed with pneumonia. On evaluation patient is edematous, decreased air movement bilaterally but no respiratory distress. She is in a fib with RVR. She was treated with Cardizem for rate control. Chest x-ray with with multifocal infiltrates. She was treated with  broad-spectrum antibiotics for possible pneumonia. Her BNP is also elevated and there is concern for acute CHF and she was treated with Lasix as well. During her ED stay she did develop agitation and required Haldol as well. Attempted to contact patient's daughter for update, no answer when called. Hospitalist consulted for admission for ongoing treatment.  Final Clinical Impression(s) / ED Diagnoses Final diagnoses:  Atrial fibrillation with rapid ventricular response ([place])  Acute congestive heart failure, unspecified heart failure type [facility])    Rx / DC Orders ED Discharge Orders    None       Quintella Reichert, MD 12/09/20 0003

## 2020-12-09 ENCOUNTER — Encounter (HOSPITAL_COMMUNITY): Payer: Self-pay | Admitting: Internal Medicine

## 2020-12-09 ENCOUNTER — Other Ambulatory Visit: Payer: Self-pay

## 2020-12-09 ENCOUNTER — Inpatient Hospital Stay (HOSPITAL_COMMUNITY): Payer: Medicare Other

## 2020-12-09 DIAGNOSIS — J9601 Acute respiratory failure with hypoxia: Secondary | ICD-10-CM

## 2020-12-09 DIAGNOSIS — Z7189 Other specified counseling: Secondary | ICD-10-CM

## 2020-12-09 DIAGNOSIS — Z515 Encounter for palliative care: Secondary | ICD-10-CM

## 2020-12-09 DIAGNOSIS — I480 Paroxysmal atrial fibrillation: Secondary | ICD-10-CM

## 2020-12-09 DIAGNOSIS — J189 Pneumonia, unspecified organism: Secondary | ICD-10-CM | POA: Diagnosis present

## 2020-12-09 LAB — RESPIRATORY PANEL BY PCR

## 2020-12-09 LAB — ECHOCARDIOGRAM COMPLETE
Area-P 1/2: 3.37 cm2
S' Lateral: 2.4 cm

## 2020-12-09 LAB — CBC
HCT: 32.7 % — ABNORMAL LOW (ref 36.0–46.0)
Hemoglobin: 10.2 g/dL — ABNORMAL LOW (ref 12.0–15.0)
MCH: 26.1 pg (ref 26.0–34.0)
MCHC: 31.2 g/dL (ref 30.0–36.0)
MCV: 83.6 fL (ref 80.0–100.0)
Platelets: 361 10*3/uL (ref 150–400)
RBC: 3.91 MIL/uL (ref 3.87–5.11)
RDW: 16.6 % — ABNORMAL HIGH (ref 11.5–15.5)
WBC: 6.5 10*3/uL (ref 4.0–10.5)
nRBC: 0 % (ref 0.0–0.2)

## 2020-12-09 LAB — BASIC METABOLIC PANEL
Anion gap: 13 (ref 5–15)
BUN: 11 mg/dL (ref 8–23)
CO2: 23 mmol/L (ref 22–32)
Calcium: 8.1 mg/dL — ABNORMAL LOW (ref 8.9–10.3)
Chloride: 99 mmol/L (ref 98–111)
Creatinine, Ser: 0.63 mg/dL (ref 0.44–1.00)
GFR, Estimated: >60 mL/min (ref >60)
Glucose, Bld: 90 mg/dL (ref 70–99)
Potassium: 3.4 mmol/L — ABNORMAL LOW (ref 3.5–5.1)
Sodium: 135 mmol/L (ref 135–145)

## 2020-12-09 LAB — MRSA PCR SCREENING: MRSA by PCR: NEGATIVE

## 2020-12-09 LAB — MAGNESIUM: Magnesium: 1.6 mg/dL — ABNORMAL LOW (ref 1.7–2.4)

## 2020-12-09 MED ORDER — DILTIAZEM HCL-DEXTROSE 125-5 MG/125ML-% IV SOLN (PREMIX)
5.0000 mg/h | INTRAVENOUS | Status: DC
  Administered 2020-12-09: 2.5 mg/h via INTRAVENOUS
  Administered 2020-12-09: 5 mg/h via INTRAVENOUS
  Filled 2020-12-09: qty 125

## 2020-12-09 MED ORDER — METOPROLOL TARTRATE 25 MG PO TABS
25.0000 mg | ORAL_TABLET | Freq: Two times a day (BID) | ORAL | Status: DC
  Administered 2020-12-10 – 2020-12-12 (×2): 25 mg via ORAL
  Filled 2020-12-09 (×3): qty 1

## 2020-12-09 MED ORDER — POTASSIUM CHLORIDE 20 MEQ PO PACK
40.0000 meq | PACK | Freq: Once | ORAL | Status: AC
  Administered 2020-12-09: 40 meq via ORAL
  Filled 2020-12-09: qty 2

## 2020-12-09 MED ORDER — VANCOMYCIN HCL 1000 MG/200ML IV SOLN
1000.0000 mg | INTRAVENOUS | Status: DC
  Administered 2020-12-09: 1000 mg via INTRAVENOUS
  Filled 2020-12-09: qty 200

## 2020-12-09 MED ORDER — IOHEXOL 300 MG/ML  SOLN
75.0000 mL | Freq: Once | INTRAMUSCULAR | Status: AC | PRN
  Administered 2020-12-09: 75 mL via INTRAVENOUS

## 2020-12-09 MED ORDER — MAGNESIUM SULFATE 2 GM/50ML IV SOLN
2.0000 g | Freq: Once | INTRAVENOUS | Status: AC
  Administered 2020-12-09: 2 g via INTRAVENOUS
  Filled 2020-12-09: qty 50

## 2020-12-09 MED ORDER — LORAZEPAM 2 MG/ML IJ SOLN
0.5000 mg | Freq: Four times a day (QID) | INTRAMUSCULAR | Status: DC | PRN
  Administered 2020-12-09: 0.5 mg via INTRAVENOUS
  Filled 2020-12-09: qty 1

## 2020-12-09 NOTE — Consult Note (Signed)
Consultation Note Date: 12/09/2020   Patient Name: [facility]  DOB: January 21, 1933  MRN: [id number]  Age / Sex: 87 y.o., female  PCP: Patient, No Pcp Per Referring Physician: Mckinley Jewel, MD  Reason for Consultation: Establishing goals of care  HPI/Patient Profile: 87 y.o. female  with past medical history of asthma, dementia, GERD, and frequent falls presented to the emergency department on 12/08/2020 with confusion and shortness of breath. She reportedly had been diagnosed with pneumonia at her SNF on 2/18. On EMS arrival to SNF, o2 saturation was around 80% and she was tachycardic in the 160's. Also with BLE edema which is reportedly new.  ED Course: Confusion turned into agitated delirium. Found to be in A fib with RVR, started on cardizem gtt. Chest x-ray showing likely pneumonia. WBC normal, no fever. BNP 306.6, troponin negative x 2, lactic acid normal. Patient was admitted to [facility] for acute hypoxic respiratory failure, acute metabolic encephalopathy, and A-fib with RVR.  Clinical Assessment and Goals of Care: I have reviewed medical records including EPIC notes, labs and imaging, and examined the patient. She is currently sleeping and I did not attempt to wake has as she was very agitated last night requiring haldol.    I met with daughter Brandy Wright in the ER consult room  to discuss diagnosis, prognosis, GOC, EOL wishes, disposition, and options.  I introduced Palliative Medicine as specialized medical care for people living with serious illness. It focuses on providing relief from the symptoms and stress of a serious illness.   We discussed a brief life review of the patient. She was born in [place], but lived in [place] as a young adult. She is a retired Therapist, sports, and has worked in the [employer], Engineer, mining, and in long-term care. She was married and had 4 children. They divorced when the children were  young. Daughter Brandy Wright lives in [place], son Brandy Wright lives in [place] about 1.5 hours away, daughter Brandy Wright lives in the [place], and daughter Brandy Wright lives in [place]. Brandy Wright is HCPOA and closely involved in her mother's care, the other children are not very involved but Brandy Wright still keeps them updated.   Patient relocated to [facility] in 1986 to be closer to family. From 2004--2017 she lived alone in an apartment in [place]. Brandy Wright reports her mother was showing signs of dementia as early as 2016. Then in 2017, she suffered her first fall which resulted in a hip fracture and need for surgical repair. She has required care in a skilled nursing home since this time. Her functional and nutritional status has slowly declined. At baseline, she is ambulatory by holding on to railing in the halls (will not use her walker) but spends most her time in a wheelchair. She requires assistance with ADLs and is incontinent of bladder/bowel. She is able to feed herself.    We discussed her current illness and what it means in the larger context of her ongoing co-morbidities.  Detailed discussion was had regarding the diagnosis of dementia and  its natural trajectory. This includes decreased ability to communicate, ambulate, swallow, and maintain continence. Explained that it is progressive and ultimately terminal disease.   We also discussed the newly diagnosed heart failure as well as the CT results concerning for malignancy. Brandy [facility] reflects that her mother has complained of "breathing problems" over the years but told she had "scar tissue" in her lungs that she presumed was from asthma. She clearly understands that malignancy indicates poor overall prognosis. She and the rest of the family are not interested in aggressive measures, including invasive work-up such as biopsy.    The difference between aggressive medical intervention and comfort care was considered in light of the patient's goals of care.  I introduced  the concept of a comfort path to [facility] and discussed at what point it would be appropriate to de-escalate medical interventions and focus on quality of life rather than prolonging life. Introduced hospice philosophy and provided information on home/SNF vs residential hospice services.   Questions and concerns were addressed. I provided Janine with my contact card and encouraged her to call with questions or concerns.    Primary decision maker: Brandy Wright (daughter and HCPOA)    SUMMARY OF RECOMMENDATIONS    DNR/DNI as previously discussed  Continue current medical care  No escalation of care  Referral made to [facility] - considering hospice services at SNF versus residential (patient does have Medicaid and would be eligible to receive services at [facility])  PMT will continue to follow  Code Status/Advance Care Planning:  DNR  Symptom Management:   Continue haloperidol (HALDOL) IV every 6 hours prn agitation  Palliative Prophylaxis:   Oral Care and Turn Reposition  Additional Recommendations (Limitations, Scope, Preferences):  No aggressive or invasive interventions  Psycho-social/Spiritual:   Created space and opportunity for family to express thoughts and feelings regarding patient's current medical situation.   Emotional support provided   Prognosis:   < 6 months  Discharge Planning: To Be Determined      Primary Diagnoses: Present on Admission: . Cognitive changes . Acute metabolic encephalopathy . Atrial fibrillation with RVR ([place]) . Acute respiratory failure with hypoxia ([place]) . Pneumonia   I have reviewed the medical record, interviewed the patient and family, and examined the patient. The following aspects are pertinent.  Past Medical History:  Diagnosis Date  . Allergic arthritis   . Asthma   . Cataract    bilateral  . GERD (gastroesophageal reflux disease)   . Osteoarthritis     Family History  Problem Relation Age of Onset   . Alzheimer's disease Mother   . Osteoarthritis Mother   . Cancer Mother        colon  . Cancer Father        colon  . Multiple sclerosis Brother   . Myasthenia gravis Brother   . Myasthenia gravis Brother    Scheduled Meds: . atorvastatin  10 mg Oral QHS  . budesonide  0.25 mg Inhalation BID  . cholecalciferol  1,000 Units Oral Daily  . enoxaparin (LOVENOX) injection  40 mg Subcutaneous Q24H  . loratadine  10 mg Oral Daily  . melatonin  3 mg Oral QHS  . metoprolol tartrate  25 mg Oral BID  . OLANZapine  7.5 mg Oral QHS  . senna-docusate  1 tablet Oral BID  . sertraline  100 mg Oral Daily   Continuous Infusions: . cefTRIAXone (ROCEPHIN)  IV Stopped (12/09/20 0451)  . doxycycline (VIBRAMYCIN) IV Stopped (12/09/20 1211)  .  vancomycin     PRN Meds:.acetaminophen, albuterol, haloperidol lactate Medications Prior to Admission:  Prior to Admission medications   Medication Sig Start Date End Date Taking? Authorizing Provider  acetaminophen (TYLENOL) 500 MG tablet Take 500 mg by mouth every 6 (six) hours as needed (for a fever of 99.5-101 F or minor discomfort).   Yes [provider]  albuterol (PROVENTIL HFA;VENTOLIN HFA) 108 (90 Base) MCG/ACT inhaler Inhale 2 puffs into the lungs every 6 (six) hours as needed for wheezing or shortness of breath. 06/09/16  Yes Breeback, Jade L, PA-C  atorvastatin (LIPITOR) 10 MG tablet Take 10 mg by mouth at bedtime.   Yes [provider]  cetirizine (ZYRTEC) 10 MG tablet Take 10 mg by mouth daily.   Yes [provider]  Cholecalciferol (VITAMIN D-3) 25 MCG (1000 UT) CAPS Take 1,000 Units by mouth daily.   Yes [provider]  fluticasone (FLOVENT HFA) 220 MCG/ACT inhaler Inhale 1 puff into the lungs daily.   Yes [provider]  loperamide (IMODIUM) 2 MG capsule Take 2 mg by mouth as needed (with each loose stool/diarrhea- not to exceed 8 doses/24 hours).   Yes [provider]  magnesium hydroxide  (MILK OF MAGNESIA) 400 MG/5ML suspension Take 30 mLs by mouth at bedtime as needed for mild constipation (and contact MD if symptoms persist longer than 24 hours).   Yes [provider]  melatonin 3 MG TABS tablet Take 3 mg by mouth at bedtime.   Yes [provider]  MI-ACID 200-200-20 MG/5ML suspension Take 30 mLs by mouth every 6 (six) hours as needed for indigestion or heartburn (and call MD if symptoms last longer than 24 hours).   Yes [provider]  neomycin-bacitracin-polymyxin (NEOSPORIN) [phone] ointment Apply 1 application topically See admin instructions. Apply to affected area as needed for minor skin tears or abrasions- cover with a Band-Aid or gauze/tape- change daily, up to 7 days   Yes [provider]  OLANZapine (ZYPREXA) 7.5 MG tablet Take 7.5 mg by mouth at bedtime.   Yes [provider]  ROBAFEN 100 MG/5ML syrup Take 200 mg by mouth every 6 (six) hours as needed for cough.   Yes [provider]  sennosides-docusate sodium (SENOKOT-S) 8.6-50 MG tablet Take 2 tablets by mouth in the morning and at bedtime.   Yes [provider]  sertraline (ZOLOFT) 100 MG tablet Take 100 mg by mouth daily.   Yes [provider]   Allergies  Allergen Reactions  . Cephalexin Itching and Other (See Comments)    Tolerated cefazolin (2017) and ceftriaxone (2020) w/o problem- "Allergic," per MAR  . Penicillins Other (See Comments)    "Allergic," per MAR   . Sulfamethoxazole-Trimethoprim Other (See Comments)    "Allergic," per MAR  . Aspirin Other (See Comments)    "Allergic," per MAR  . Atrovent [Ipratropium] Other (See Comments)    Became dizzy,  when tried- "allergic," per MAR  . Qnasl [Beclomethasone] Other (See Comments)    Nasal burning- "allergic," per MAR  . Zithromax [Azithromycin] Other (See Comments)    "Allergic," per MAR  . Ciprofloxacin Diarrhea and Other (See Comments)    "Allergic," per MAR  .  Pseudoephedrine Hcl Palpitations and Other (See Comments)    "allergic," per MAR   Review of Systems  Unable to perform ROS: Mental status change    Physical Exam Vitals reviewed.  Constitutional:      General: She is sleeping. She is not in  acute distress.    Appearance: She is ill-appearing.     Comments: Frail appearing  Cardiovascular:     Rate and Rhythm: Normal rate and regular rhythm.     Comments: Sinus rhythm in the 70's Pulmonary:     Effort: Pulmonary effort is normal.     Vital Signs: BP 115/63   Pulse 73   Temp 98.6 F (37 C) (Rectal)   Resp 18   SpO2 100%         SpO2: SpO2: 100 % O2 Device:SpO2: 100 % O2 Flow Rate: .O2 Flow Rate (L/min): 4 L/min  IO: Intake/output summary:   Intake/Output Summary (Last 24 hours) at 12/09/2020 1218 Last data filed at 12/09/2020 1211 Gross per 24 hour  Intake 250 ml  Output 600 ml  Net -350 ml      Palliative Assessment/Data: PPS 30%     Time In: 12:18 Time Out: 13:34 Time Total: 76 minutes Greater than 50%  of this time was spent counseling and coordinating care related to the above assessment and plan.  Signed by: Lavena Bullion, NP   Please contact Palliative Medicine Team phone at [phone] for questions and concerns.  For individual provider: See Shea Evans

## 2020-12-09 NOTE — Progress Notes (Signed)
  Echocardiogram 2D Echocardiogram has been performed.  Brandy Wright 12/09/2020, 11:33 AM

## 2020-12-09 NOTE — Progress Notes (Addendum)
Back in A.Fib RVR with rate 114.  Resuming cardizem gtt.  Procalcitonin < 0.10.  CTA chest still pending, but becoming more suspicious that CXR findings may be due to new onset CHF rather than PNA.  Got 40mg  lasix earlier in evening in ED, BP currently 105/72.  Pt sleeping after 4mg  total haldol a couple hours ago.

## 2020-12-09 NOTE — ED Notes (Signed)
MD Pahwani paged. RN requesting PRN medication for heart rate control for when pt transitions off of dilt drip.

## 2020-12-09 NOTE — Progress Notes (Addendum)
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  Discussed with daughter at bedside: 1) the mass is known to family; however, given pt age and co-morbidities they didn't really want further work up back in 2020. 2) Continue to treat as CHF (probably rate induced) 3) will leave ABx alone for the moment but day team may wish to stop them if remainder of labs come back non-suggestive of infection this AM 4) 2d echo still pending 5) may wish to get pal care consult given the mass (? Lung CA), re: goals of care.  Especially if pt doesn't have rapid improvement medically.

## 2020-12-09 NOTE — Progress Notes (Signed)
Pharmacy Antibiotic Note  Brandy Wright is a 87 y.o. female admitted on 12/08/2020 with pneumonia.  Patient presents from [facility] where she was diagnosed with pneumonia last Friday.  Pharmacy has been consulted for vancomycin dosing. Also on ceftriaxone and doxy. SCr stable 0.63.  Last weight 72kg in 10/2019- patient unable to confirm weight.  Plan: Adjust vancomycin to 1g IV q24h. Goal AUC 400-550. Expected AUC: 506 SCr used: 0.8 Ceftriaxone 2g IV q24h Doxy 100mg  IV q12h Monitor clinical progress, c/s, renal function F/u de-escalation plan/LOT, vancomycin levels as indicated     Temp (24hrs), Avg:98.2 F (36.8 C), Min:97.8 F (36.6 C), Max:98.6 F (37 C)  Recent Labs  Lab 12/08/20 1503 12/08/20 1523 12/09/20 0328  WBC 9.2  --  6.5  CREATININE  --  0.63 0.63  LATICACIDVEN  --  1.5  --     CrCl cannot be calculated (Unknown ideal weight.).    Allergies  Allergen Reactions  . Cephalexin Itching and Other (See Comments)    Tolerated cefazolin (2017) and ceftriaxone (2020) w/o problem- "Allergic," per MAR  . Penicillins Other (See Comments)    "Allergic," per MAR   . Sulfamethoxazole-Trimethoprim Other (See Comments)    "Allergic," per MAR  . Aspirin Other (See Comments)    "Allergic," per MAR  . Atrovent [Ipratropium] Other (See Comments)    Became dizzy,  when tried- "allergic," per MAR  . Qnasl [Beclomethasone] Other (See Comments)    Nasal burning- "allergic," per MAR  . Zithromax [Azithromycin] Other (See Comments)    "Allergic," per MAR  . Ciprofloxacin Diarrhea and Other (See Comments)    "Allergic," per MAR  . Pseudoephedrine Hcl Palpitations and Other (See Comments)    "allergic," per [facility]    Arturo Morton, PharmD, BCPS Please check AMION for all [facility] contact numbers Clinical Pharmacist 12/09/2020 10:19 AM

## 2020-12-09 NOTE — Progress Notes (Signed)
PROGRESS NOTE    Brandy Wright  RKY:[id number] DOB: May 30, 1933 DOA: 12/08/2020 PCP: Patient, No Pcp Per   Brief Narrative:  Patient is 87 year old female with past medical history of Alzheimer's dementia, asthma, frequent falls presents to emergency department with shortness of breath and confusion.  Patient reportedly diagnosed with pneumonia at SNF on Friday and yesterday she was found to have oxygen saturation 80% and tachycardic to 160s.  Upon arrival to ED: Patient confused, agitated, satting upper 80s on room air-improved on oxygen.  A. fib with RVR was placed on Cardizem gtt.  Chest x-ray with likely pneumonia.  Patient started on cefepime and vancomycin.  Troponin x2 -.  Patient was also given 40 mg IV Lasix suspected CHF.  CT chest concerning for neoplasm?    Assessment & Plan:   Acute hypoxemic respiratory failure: -In the setting of postobstructive pneumonia and/or new onset CHF in the setting of A. fib with RVR  -remained afebrile, no leukocytosis.  COVID-19 negative.  PCT:<10 -Reviewed CT chest-concerning for underlying neoplasm with partial occlusion of left main stem bronchus and complete occlusion of left upper lobe and lower lobe lung airways. -Continue IV antibiotics at this time. -We will try to wean off of oxygen as tolerated.  A. fib with RVR: -Started on Cardizem gtt. in ED.  Now rate controlled.  She is currently off of gtt.-started patient on metoprolol 25 mg twice daily.  Monitor heart rate closely on telemetry.  Echo is pending. -She is not a good candidate for long-term anticoagulation at this time due to confusion and frequent falls.  New onset CHF (unknown type): -Reviewed chest x-ray and CT chest. -BNP elevated-306 -Received Lasix in ED.  Echo is pending -Strict INO's and daily weight.  Alzheimer's dementia/delirium: -Continue home meds-Zyprexa and Zoloft.  UA is positive for leukocytes and bacteria.  Urine culture is pending -Haldol as  needed. -Consult PT/OT.  Hypokalemia: Replenished.  Check magnesium level.  Repeat BMP tomorrow a.m.  Hyperlipidemia: Continue statin  Patient has overall poor prognosis in the setting of Alzheimer's dementia, frequent falls and?  Lung malignancy.  Family does not want extensive intervention at this time.  Patient is DNR.  We will consult palliative to discuss comfort measures/hospice.  DVT prophylaxis: Lovenox Code Status: DNR Family Communication: Patient's daughter present at bedside.  Plan of care discussed with patient in length and he verbalized understanding and agreed with it. Disposition Plan: To be determined  Consultants:  Palliative care Procedures:   None  Antimicrobials:   Cefepime  Vancomycin  Rocephin  Doxy  Status is: Inpatient   Dispo: The patient is from: SNF              Anticipated d/c is to: SNF              Anticipated d/c date is: 2 days              Patient currently is not medically stable to d/c.   Difficult to place patient No    Subjective: Patient seen and examined in ED.  Daughter at the bedside.  Patient appears confused, alert and oriented to person only.  Remained afebrile.  Daughter does not want extensive measures/intervention at this time and wishes her mom to feel better/comfortable.  Objective: Vitals:   12/09/20 0815 12/09/20 0830 12/09/20 0845 12/09/20 1045  BP: (!) 112/53 (!) 104/49 (!) 130/49 115/63  Pulse: 74 78 83 73  Resp: 20 19 (!) 27 18  Temp:  TempSrc:      SpO2: 95% 98% 97% 100%    Intake/Output Summary (Last 24 hours) at 12/09/2020 1055 Last data filed at 12/09/2020 1009 Gross per 24 hour  Intake --  Output 600 ml  Net -600 ml   There were no vitals filed for this visit.  Examination:  General exam: Elderly looking female, on nasal cannula, appears confused  respiratory system: Clear to auscultation. Respiratory effort normal. Cardiovascular system: S1 & S2 heard, RRR. No JVD, murmurs, rubs,  gallops or clicks. No pedal edema. Gastrointestinal system: Abdomen is nondistended, soft and nontender. No organomegaly or masses felt. Normal bowel sounds heard. Central nervous system: Alert, oriented x1, Extremities: Symmetric 5 x 5 power. Skin: No rashes, lesions or ulcers   Data Reviewed: I have personally reviewed following labs and imaging studies  CBC: Recent Labs  Lab 12/08/20 1503 12/09/20 0328  WBC 9.2 6.5  HGB 10.8* 10.2*  HCT 35.4* 32.7*  MCV 83.3 83.6  PLT 435* 962   Basic Metabolic Panel: Recent Labs  Lab 12/08/20 1523 12/09/20 0328  NA 137 135  K 3.9 3.4*  CL 100 99  CO2 26 23  GLUCOSE 103* 90  BUN 13 11  CREATININE 0.63 0.63  CALCIUM 8.9 8.1*   GFR: CrCl cannot be calculated (Unknown ideal weight.). Liver Function Tests: Recent Labs  Lab 12/08/20 1523  AST 21  ALT 17  ALKPHOS 95  BILITOT 0.5  PROT 6.7  ALBUMIN 2.7*   No results for input(s): LIPASE, AMYLASE in the last 168 hours. No results for input(s): AMMONIA in the last 168 hours. Coagulation Profile: Recent Labs  Lab 12/08/20 1523  INR 1.2   Cardiac Enzymes: No results for input(s): CKTOTAL, CKMB, CKMBINDEX, TROPONINI in the last 168 hours. BNP (last 3 results) No results for input(s): PROBNP in the last 8760 hours. HbA1C: No results for input(s): HGBA1C in the last 72 hours. CBG: No results for input(s): GLUCAP in the last 168 hours. Lipid Profile: No results for input(s): CHOL, HDL, LDLCALC, TRIG, CHOLHDL, LDLDIRECT in the last 72 hours. Thyroid Function Tests: No results for input(s): TSH, T4TOTAL, FREET4, T3FREE, THYROIDAB in the last 72 hours. Anemia Panel: No results for input(s): VITAMINB12, FOLATE, FERRITIN, TIBC, IRON, RETICCTPCT in the last 72 hours. Sepsis Labs: Recent Labs  Lab 12/08/20 1523 12/08/20 2220  PROCALCITON  --  <0.10  LATICACIDVEN 1.5  --     Recent Results (from the past 240 hour(s))  Resp Panel by RT-PCR (Flu A&B, Covid) Nasopharyngeal Swab      Status: None   Collection Time: 12/08/20  3:28 PM   Specimen: Nasopharyngeal Swab; Nasopharyngeal(NP) swabs in vial transport medium  Result Value Ref Range Status   SARS Coronavirus 2 by RT PCR NEGATIVE NEGATIVE Final    Comment: (NOTE) SARS-CoV-2 target nucleic acids are NOT DETECTED.  The SARS-CoV-2 RNA is generally detectable in upper respiratory specimens during the acute phase of infection. The lowest concentration of SARS-CoV-2 viral copies this assay can detect is 138 copies/mL. A negative result does not preclude SARS-Cov-2 infection and should not be used as the sole basis for treatment or other patient management decisions. A negative result may occur with  improper specimen collection/handling, submission of specimen other than nasopharyngeal swab, presence of viral mutation(s) within the areas targeted by this assay, and inadequate number of viral copies(<138 copies/mL). A negative result must be combined with clinical observations, patient history, and epidemiological information. The expected result is Negative.  Fact Sheet  for Patients:  EntrepreneurPulse.com.au  Fact Sheet for Healthcare Providers:  IncredibleEmployment.be  This test is no t yet approved or cleared by the [place] FDA and  has been authorized for detection and/or diagnosis of SARS-CoV-2 by FDA under an Emergency Use Authorization (EUA). This EUA will remain  in effect (meaning this test can be used) for the duration of the COVID-19 declaration under Section 564(b)(1) of the Act, 21 U.S.C.section 360bbb-3(b)(1), unless the authorization is terminated  or revoked sooner.       Influenza A by PCR NEGATIVE NEGATIVE Final   Influenza B by PCR NEGATIVE NEGATIVE Final    Comment: (NOTE) The Xpert Xpress SARS-CoV-2/FLU/RSV plus assay is intended as an aid in the diagnosis of influenza from Nasopharyngeal swab specimens and should not be used as a sole  basis for treatment. Nasal washings and aspirates are unacceptable for Xpert Xpress SARS-CoV-2/FLU/RSV testing.  Fact Sheet for Patients: EntrepreneurPulse.com.au  Fact Sheet for Healthcare Providers: IncredibleEmployment.be  This test is not yet approved or cleared by the [place] FDA and has been authorized for detection and/or diagnosis of SARS-CoV-2 by FDA under an Emergency Use Authorization (EUA). This EUA will remain in effect (meaning this test can be used) for the duration of the COVID-19 declaration under Section 564(b)(1) of the Act, 21 U.S.C. section 360bbb-3(b)(1), unless the authorization is terminated or revoked.  Performed at [facility], [place] [address] [place], [place] [address]   Culture, blood (routine x 2)     Status: None (Preliminary result)   Collection Time: 12/08/20  3:47 PM   Specimen: BLOOD  Result Value Ref Range Status   Specimen Description BLOOD LEFT ANTECUBITAL  Final   Special Requests   Final    BOTTLES DRAWN AEROBIC ONLY Blood Culture results may not be optimal due to an inadequate volume of blood received in culture bottles   Culture   Final    NO GROWTH < 24 HOURS Performed at [facility], [place] [address] [place], [place] [address]    Report Status PENDING  Incomplete  Culture, blood (routine x 2)     Status: None (Preliminary result)   Collection Time: 12/08/20  4:23 PM   Specimen: BLOOD  Result Value Ref Range Status   Specimen Description BLOOD SITE NOT SPECIFIED  Final   Special Requests   Final    BOTTLES DRAWN AEROBIC ONLY Blood Culture results may not be optimal due to an inadequate volume of blood received in culture bottles   Culture   Final    NO GROWTH < 12 HOURS Performed at [facility], [place] [address] [place], [place] [address]    Report Status PENDING  Incomplete      Radiology Studies: CT CHEST W CONTRAST  Result Date: 12/09/2020 CLINICAL DATA:   Pneumonia, effusion or abscess suspected. EXAM: CT CHEST WITH CONTRAST TECHNIQUE: Multidetector CT imaging of the chest was performed during intravenous contrast administration. CONTRAST:  92mL OMNIPAQUE IOHEXOL 300 MG/ML  SOLN COMPARISON:  October 15, 2019 FINDINGS: Cardiovascular: Mild to moderate severity calcification of the aortic arch is noted. Normal heart size. There is a small pericardial effusion. Mediastinum/Nodes: A stable ill-defined area of soft tissue attenuation is noted along the left hilum with partial occlusion of the left mainstem bronchus. Thyroid gland, trachea, and esophagus demonstrate no significant findings. Lungs/Pleura: Stable moderate to marked severity areas of scarring and/or atelectasis are seen within the posteromedial aspect of the right apex and inferior  aspect of the left upper lobe. Mild to moderate severity right lower lobe and moderate to marked severity left lower lobe atelectasis and/or infiltrate is seen. There is a small to moderate sized partially loculated right pleural effusion. A large left pleural effusion is also noted. No pneumothorax is identified. Upper Abdomen: No acute abnormality. Musculoskeletal: No chest wall abnormality. No acute or significant osseous findings. IMPRESSION: 1. Area of ill-defined soft tissue attenuation within the region of the left hilum with partial occlusion of the left mainstem bronchus and complete occlusion of the left upper lobe and lower lobe lung airways. This is increased in size when compared to the prior study. Sequelae associated with an underlying neoplasm cannot be excluded. Further evaluation with nuclear medicine PET/CT is recommended. 2. Stable moderate to marked severity areas of right apical and left upper lobe scarring and/or atelectasis. 3. Mild to moderate severity right lower lobe and moderate to marked severity left lower lobe atelectasis and/or infiltrate. Follow-up to resolution is recommended to exclude the  presence of an underlying neoplastic process. 4. Bilateral pleural effusions, left larger than right. 5. Small pericardial effusion. 6. Aortic atherosclerosis. Aortic Atherosclerosis (ICD10-I70.0). Electronically Signed   By: Virgina Norfolk M.D.   On: 12/09/2020 02:47   DG Chest Port 1 View  Result Date: 12/08/2020 CLINICAL DATA:  Short of breath EXAM: PORTABLE CHEST 1 VIEW COMPARISON:  04/26/2020, CT chest 10/15/2019 FINDINGS: Volume loss left hemithorax with shift of mediastinal contents to the left. Increased left pleural disease or thickening. Redemonstrated left perihilar opacity with new or worsened consolidation at the left base. Cardiomediastinal silhouette largely obscured. Aortic atherosclerosis. Patchy opacities in the right thorax. IMPRESSION: 1. Worsening aeration with new or worsened consolidation at the left base which may be due to pneumonia or postobstructive change. 2. Increased left pleural disease/pleural thickening with shift of mediastinal contents to the left. Persistent left perihilar opacity. 3. Patchy opacities in the right thorax may reflect diffuse foci of infection Electronically Signed   By: Donavan Foil M.D.   On: 12/08/2020 15:51    Scheduled Meds: . atorvastatin  10 mg Oral QHS  . budesonide  0.25 mg Inhalation BID  . cholecalciferol  1,000 Units Oral Daily  . enoxaparin (LOVENOX) injection  40 mg Subcutaneous Q24H  . loratadine  10 mg Oral Daily  . melatonin  3 mg Oral QHS  . metoprolol tartrate  25 mg Oral BID  . OLANZapine  7.5 mg Oral QHS  . senna-docusate  1 tablet Oral BID  . sertraline  100 mg Oral Daily   Continuous Infusions: . cefTRIAXone (ROCEPHIN)  IV Stopped (12/09/20 0451)  . diltiazem (CARDIZEM) infusion Stopped (12/09/20 1010)  . doxycycline (VIBRAMYCIN) IV 100 mg (12/09/20 1000)  . vancomycin       LOS: 1 day   Time spent: 40 minutes  Aastha Dayley Loann Quill, MD [facility]  If 7PM-7AM, please contact  night-coverage [email or web address] 12/09/2020, 10:55 AM

## 2020-12-09 NOTE — ED Notes (Signed)
Lunch Tray Ordered @ 1006.  

## 2020-12-09 NOTE — ED Notes (Signed)
[facility] [place] paged for pt reporting bladder discomfort r/t to bladder retention. Bladder scan showed roughly 400 cc of urine. MD Pahwani told this RN to in and out. MD Pahwani to also put in medication for heart rate control.

## 2020-12-09 NOTE — ED Notes (Signed)
Help do and in and out cath on patient changed patient bed put clean sheets on the bed put a new brief and external cath on patient is resting with family at bedside and call bell in reach

## 2020-12-10 DIAGNOSIS — F0391 Unspecified dementia with behavioral disturbance: Secondary | ICD-10-CM

## 2020-12-10 DIAGNOSIS — J189 Pneumonia, unspecified organism: Principal | ICD-10-CM

## 2020-12-10 DIAGNOSIS — R8281 Pyuria: Secondary | ICD-10-CM

## 2020-12-10 DIAGNOSIS — F10231 Alcohol dependence with withdrawal delirium: Secondary | ICD-10-CM

## 2020-12-10 DIAGNOSIS — R8271 Bacteriuria: Secondary | ICD-10-CM

## 2020-12-10 DIAGNOSIS — Z66 Do not resuscitate: Secondary | ICD-10-CM

## 2020-12-10 DIAGNOSIS — R918 Other nonspecific abnormal finding of lung field: Secondary | ICD-10-CM

## 2020-12-10 DIAGNOSIS — R410 Disorientation, unspecified: Secondary | ICD-10-CM

## 2020-12-10 LAB — URINE CULTURE: Culture: <10000 — AB

## 2020-12-10 LAB — BASIC METABOLIC PANEL WITH GFR
Anion gap: 11 (ref 5–15)
BUN: 9 mg/dL (ref 8–23)
CO2: 24 mmol/L (ref 22–32)
Calcium: 8.6 mg/dL — ABNORMAL LOW (ref 8.9–10.3)
Chloride: 104 mmol/L (ref 98–111)
Creatinine, Ser: 0.73 mg/dL (ref 0.44–1.00)
GFR, Estimated: >60 mL/min
Glucose, Bld: 75 mg/dL (ref 70–99)
Potassium: 3.6 mmol/L (ref 3.5–5.1)
Sodium: 139 mmol/L (ref 135–145)

## 2020-12-10 LAB — MAGNESIUM: Magnesium: 2.2 mg/dL (ref 1.7–2.4)

## 2020-12-10 MED ORDER — MORPHINE SULFATE (CONCENTRATE) 10 MG/0.5ML PO SOLN
5.0000 mg | Freq: Four times a day (QID) | ORAL | Status: DC
Start: 2020-12-10 — End: 2020-12-11
  Administered 2020-12-10: 5 mg via SUBLINGUAL
  Filled 2020-12-10: qty 0.5

## 2020-12-10 MED ORDER — ONDANSETRON 4 MG PO TBDP: 4.0000 mg | ORAL_TABLET | Freq: Four times a day (QID) | ORAL | Status: DC | PRN

## 2020-12-10 MED ORDER — ONDANSETRON HCL 4 MG/2ML IJ SOLN: 4.0000 mg | Freq: Four times a day (QID) | INTRAMUSCULAR | Status: DC | PRN

## 2020-12-10 MED ORDER — GLYCOPYRROLATE 0.2 MG/ML IJ SOLN
0.2000 mg | INTRAMUSCULAR | Status: DC | PRN
  Administered 2020-12-15 – 2020-12-16 (×2): 0.2 mg via INTRAVENOUS
  Filled 2020-12-10 (×2): qty 1

## 2020-12-10 MED ORDER — ALBUTEROL SULFATE (2.5 MG/3ML) 0.083% IN NEBU: 2.5000 mg | INHALATION_SOLUTION | Freq: Four times a day (QID) | RESPIRATORY_TRACT | Status: DC | PRN

## 2020-12-10 MED ORDER — GLYCOPYRROLATE 0.2 MG/ML IJ SOLN: 0.2000 mg | INTRAMUSCULAR | Status: DC | PRN

## 2020-12-10 MED ORDER — POLYVINYL ALCOHOL 1.4 % OP SOLN
1.0000 [drp] | Freq: Four times a day (QID) | OPHTHALMIC | Status: DC | PRN
  Filled 2020-12-10: qty 15

## 2020-12-10 MED ORDER — BIOTENE DRY MOUTH MT LIQD: 15.0000 mL | OROMUCOSAL | Status: DC | PRN

## 2020-12-10 MED ORDER — LORAZEPAM 2 MG/ML IJ SOLN
1.0000 mg | INTRAMUSCULAR | Status: DC | PRN
  Administered 2020-12-12 – 2020-12-16 (×2): 1 mg via INTRAVENOUS
  Filled 2020-12-10 (×2): qty 1

## 2020-12-10 NOTE — Progress Notes (Signed)
PROGRESS NOTE  Brandy Wright VZD:[id number] DOB: 03-Apr-1933   PCP: Patient, No Pcp Per  Patient is from: SNF  DOA: 12/08/2020 LOS: 2  Chief complaints: Shortness of breath and altered mental status  Brief Narrative / Interim history: 87 year old AAF with PMH of Alzheimer's dementia, asthma, frequent falls and recent pneumonia brought to ED from SNF with AMS, hypoxia to 80% and tachycardia to 160s.  Upon arrival to ED, patient was confused, agitated and hypoxic to 80s.  She was also in A. fib with RVR and placed on Cardizem drip.  Started on broad-spectrum antibiotics for postobstructive pneumonia due to possible malignancy.  Overall improving. Plan is for discharge back to SNF once oxygen requirement and mental status improved.  Subjective: Seen and examined earlier this morning.  Patient was sleepy.  Reportedly she received some Ativan overnight due to confusion and agitation.  She briefly wakes to voice but cannot hold conversation and follows command.  She does not appear to be in distress.  Objective: Vitals:   12/09/20 1507 12/09/20 2144 12/10/20 0551 12/10/20 0759  BP: 129/68 (!) 93/58 130/64   Pulse: 94 82 88   Resp: 16 19 20    Temp: 98.6 F (37 C) 97.6 F (36.4 C) 97.8 F (36.6 C)   TempSrc:  Oral    SpO2: 100%  (!) 68% (!) 80%  Weight: 64.6 kg     Height: 5\' 5"  (1.651 m)       Intake/Output Summary (Last 24 hours) at 12/10/2020 1524 Last data filed at 12/10/2020 0700 Gross per 24 hour  Intake 250 ml  Output 150 ml  Net 100 ml   Filed Weights   12/09/20 1507  Weight: 64.6 kg    Examination:  GENERAL: No apparent distress.  Nontoxic. HEENT: MMM.  Vision and hearing grossly intact.  NECK: Supple.  No apparent JVD.  RESP: 100% on 5 L.  No IWOB.  Fair aeration bilaterally. CVS:  RRR. Heart sounds normal.  ABD/GI/GU: BS+. Abd soft, NTND.  MSK/EXT:  Moves extremities. No apparent deformity. No edema.  SKIN: no apparent skin lesion or wound NEURO: Sleepy.   Briefly wakes to voice but falls back to sleep.  Does not follows command.  No apparent focal neuro deficit but limited exam.. PSYCH: Calm. Normal affect.  Procedures:  None  Microbiology summarized: COVID-19 PCR nonreactive. MRSA PCR screen negative Blood cultures NGTD Urine culture without significant growth.  Assessment & Plan: Acute hypoxemic respiratory failure due to postobstructive pneumonia in the setting of possible lung neoplasm as noted on CT chest.: -Continue ceftriaxone and doxycycline -Discontinued vancomycin-MRSA PCR negative. -Wean oxygen as able   A. fib with RVR: Converted to sinus rhythm. -Continue metoprolol 25 mg twice daily -Not a candidate for anticoagulation due to frequent falls  Possible diastolic heart failure-TTE with normal LVEF but undetermined diastolic dysfunction.  BNP was elevated to 316.  However, patient appears euvolemic.  -No diuretics. -Monitor fluid status  Alzheimer's dementia/delirium: -Continue home Zyprexa and Zoloft.   -As needed Haldol  Pyuria/bacteriuria: Urine culture with insignificant growth. -Already on ceftriaxone for pneumonia  Hypokalemia/hypomagnesemia: Resolved  Normocytic anemia: Stable Recent Labs    04/26/20 1411 09/27/20 1538 10/08/20 0714 12/08/20 1503 12/09/20 0328  HGB 12.5 11.3* 11.4* 10.8* 10.2*   Hyperlipidemia:  -Continue statin but doubt benefit given her age and advanced dementia.  Debility/physical deconditioning -PT/OT eval  Goal of care/DNR/DNI-appropriate. -Appreciate help by PMT -Hospice follow-up at SNF  Body mass index is 23.7 kg/m.  DVT prophylaxis:  enoxaparin (LOVENOX) injection 40 mg Start: 12/08/20 2130  Code Status: DNR/DNI Family Communication: Updated patient's daughter at bedside. Level of care: Telemetry Medical Status is: Inpatient  Remains inpatient appropriate because:Altered mental status, Unsafe d/c plan, IV treatments appropriate due to intensity  of illness or inability to take PO and Inpatient level of care appropriate due to severity of illness   Dispo: The patient is from: SNF              Anticipated d/c is to: SNF              Anticipated d/c date is: 1 day              Patient currently is not medically stable to d/c.   Difficult to place patient No       Consultants:  Palliative medicine   Sch Meds:  Scheduled Meds: . atorvastatin  10 mg Oral QHS  . budesonide  0.25 mg Inhalation BID  . cholecalciferol  1,000 Units Oral Daily  . enoxaparin (LOVENOX) injection  40 mg Subcutaneous Q24H  . loratadine  10 mg Oral Daily  . melatonin  3 mg Oral QHS  . metoprolol tartrate  25 mg Oral BID  . OLANZapine  7.5 mg Oral QHS  . senna-docusate  1 tablet Oral BID  . sertraline  100 mg Oral Daily   Continuous Infusions: . cefTRIAXone (ROCEPHIN)  IV Stopped (12/10/20 4742)  . doxycycline (VIBRAMYCIN) IV 100 mg (12/10/20 0945)   PRN Meds:.acetaminophen, albuterol, haloperidol lactate, LORazepam  Antimicrobials: Anti-infectives (From admission, onward)   Start     Dose/Rate Route Frequency Ordered Stop   12/10/20 1600  vancomycin (VANCOREADY) IVPB 1750 mg/350 mL  Status:  Discontinued        1,750 mg 175 mL/hr over 120 Minutes Intravenous Every 48 hours 12/08/20 1637 12/09/20 1023   12/09/20 1700  vancomycin (VANCOREADY) IVPB 1000 mg/200 mL  Status:  Discontinued        1,000 mg 200 mL/hr over 60 Minutes Intravenous Every 24 hours 12/09/20 1023 12/10/20 0751   12/09/20 0500  cefTRIAXone (ROCEPHIN) 2 g in sodium chloride 0.9 % 100 mL IVPB        2 g 200 mL/hr over 30 Minutes Intravenous Every 24 hours 12/08/20 2126 12/14/20 0459   12/08/20 2130  doxycycline (VIBRAMYCIN) 100 mg in sodium chloride 0.9 % 250 mL IVPB        100 mg 125 mL/hr over 120 Minutes Intravenous Every 12 hours 12/08/20 2126     12/08/20 1600  vancomycin (VANCOREADY) IVPB 1500 mg/300 mL        1,500 mg 150 mL/hr over 120 Minutes Intravenous  Once  12/08/20 1554 12/08/20 1906   12/08/20 1545  ceFEPIme (MAXIPIME) 2 g in sodium chloride 0.9 % 100 mL IVPB        2 g 200 mL/hr over 30 Minutes Intravenous  Once 12/08/20 1534 12/08/20 1703       I have personally reviewed the following labs and images: CBC: Recent Labs  Lab 12/08/20 1503 12/09/20 0328  WBC 9.2 6.5  HGB 10.8* 10.2*  HCT 35.4* 32.7*  MCV 83.3 83.6  PLT 435* 361   BMP &GFR Recent Labs  Lab 12/08/20 1523 12/09/20 0328 12/09/20 1526 12/10/20 0259  NA 137 135  --  139  K 3.9 3.4*  --  3.6  CL 100 99  --  104  CO2 26 23  --  24  GLUCOSE 103* 90  --  75  BUN 13 11  --  9  CREATININE 0.63 0.63  --  0.73  CALCIUM 8.9 8.1*  --  8.6*  MG  --   --  1.6* 2.2   Estimated Creatinine Clearance: 46.3 mL/min (by C-G formula based on SCr of 0.73 mg/dL). Liver & Pancreas: Recent Labs  Lab 12/08/20 1523  AST 21  ALT 17  ALKPHOS 95  BILITOT 0.5  PROT 6.7  ALBUMIN 2.7*   No results for input(s): LIPASE, AMYLASE in the last 168 hours. No results for input(s): AMMONIA in the last 168 hours. Diabetic: No results for input(s): HGBA1C in the last 72 hours. No results for input(s): GLUCAP in the last 168 hours. Cardiac Enzymes: No results for input(s): CKTOTAL, CKMB, CKMBINDEX, TROPONINI in the last 168 hours. No results for input(s): PROBNP in the last 8760 hours. Coagulation Profile: Recent Labs  Lab 12/08/20 1523  INR 1.2   Thyroid Function Tests: No results for input(s): TSH, T4TOTAL, FREET4, T3FREE, THYROIDAB in the last 72 hours. Lipid Profile: No results for input(s): CHOL, HDL, LDLCALC, TRIG, CHOLHDL, LDLDIRECT in the last 72 hours. Anemia Panel: No results for input(s): VITAMINB12, FOLATE, FERRITIN, TIBC, IRON, RETICCTPCT in the last 72 hours. Urine analysis:    Component Value Date/Time   COLORURINE YELLOW 12/08/2020 1918   APPEARANCEUR CLEAR 12/08/2020 1918   LABSPEC 1.009 12/08/2020 1918   PHURINE 5.0 12/08/2020 1918   GLUCOSEU NEGATIVE  12/08/2020 1918   HGBUR NEGATIVE 12/08/2020 1918   BILIRUBINUR NEGATIVE 12/08/2020 1918   KETONESUR NEGATIVE 12/08/2020 1918   PROTEINUR NEGATIVE 12/08/2020 1918   NITRITE NEGATIVE 12/08/2020 1918   LEUKOCYTESUR LARGE (A) 12/08/2020 1918   Sepsis Labs: Invalid input(s): PROCALCITONIN, [place]  Microbiology: Recent Results (from the past 240 hour(s))  Resp Panel by RT-PCR (Flu A&B, Covid) Nasopharyngeal Swab     Status: None   Collection Time: 12/08/20  3:28 PM   Specimen: Nasopharyngeal Swab; Nasopharyngeal(NP) swabs in vial transport medium  Result Value Ref Range Status   SARS Coronavirus 2 by RT PCR NEGATIVE NEGATIVE Final    Comment: (NOTE) SARS-CoV-2 target nucleic acids are NOT DETECTED.  The SARS-CoV-2 RNA is generally detectable in upper respiratory specimens during the acute phase of infection. The lowest concentration of SARS-CoV-2 viral copies this assay can detect is 138 copies/mL. A negative result does not preclude SARS-Cov-2 infection and should not be used as the sole basis for treatment or other patient management decisions. A negative result may occur with  improper specimen collection/handling, submission of specimen other than nasopharyngeal swab, presence of viral mutation(s) within the areas targeted by this assay, and inadequate number of viral copies(<138 copies/mL). A negative result must be combined with clinical observations, patient history, and epidemiological information. The expected result is Negative.  Fact Sheet for Patients:  EntrepreneurPulse.com.au  Fact Sheet for Healthcare Providers:  IncredibleEmployment.be  This test is no t yet approved or cleared by the [place] FDA and  has been authorized for detection and/or diagnosis of SARS-CoV-2 by FDA under an Emergency Use Authorization (EUA). This EUA will remain  in effect (meaning this test can be used) for the duration of the COVID-19  declaration under Section 564(b)(1) of the Act, 21 U.S.C.section 360bbb-3(b)(1), unless the authorization is terminated  or revoked sooner.       Influenza A by PCR NEGATIVE NEGATIVE Final   Influenza B by PCR NEGATIVE NEGATIVE Final    Comment: (NOTE) The Xpert  Xpress SARS-CoV-2/FLU/RSV plus assay is intended as an aid in the diagnosis of influenza from Nasopharyngeal swab specimens and should not be used as a sole basis for treatment. Nasal washings and aspirates are unacceptable for Xpert Xpress SARS-CoV-2/FLU/RSV testing.  Fact Sheet for Patients: EntrepreneurPulse.com.au  Fact Sheet for Healthcare Providers: IncredibleEmployment.be  This test is not yet approved or cleared by the [place] FDA and has been authorized for detection and/or diagnosis of SARS-CoV-2 by FDA under an Emergency Use Authorization (EUA). This EUA will remain in effect (meaning this test can be used) for the duration of the COVID-19 declaration under Section 564(b)(1) of the Act, 21 U.S.C. section 360bbb-3(b)(1), unless the authorization is terminated or revoked.  Performed at [facility], [place] [address], [place], [place] [address]   Culture, blood (routine x 2)     Status: None (Preliminary result)   Collection Time: 12/08/20  3:47 PM   Specimen: BLOOD  Result Value Ref Range Status   Specimen Description BLOOD LEFT ANTECUBITAL  Final   Special Requests   Final    BOTTLES DRAWN AEROBIC ONLY Blood Culture results may not be optimal due to an inadequate volume of blood received in culture bottles   Culture   Final    NO GROWTH 2 DAYS Performed at [facility], [place] [address], [place], [place] [address]    Report Status PENDING  Incomplete  Culture, blood (routine x 2)     Status: None (Preliminary result)   Collection Time: 12/08/20  4:23 PM   Specimen: BLOOD  Result Value Ref Range Status   Specimen Description BLOOD SITE NOT SPECIFIED   Final   Special Requests   Final    BOTTLES DRAWN AEROBIC ONLY Blood Culture results may not be optimal due to an inadequate volume of blood received in culture bottles   Culture   Final    NO GROWTH 2 DAYS Performed at [facility], [place] [address], [place], [place] [address]    Report Status PENDING  Incomplete  Culture, Urine     Status: Abnormal   Collection Time: 12/08/20  8:10 PM   Specimen: Urine, Random  Result Value Ref Range Status   Specimen Description URINE, RANDOM  Final   Special Requests NONE  Final   Culture (A)  Final    <10,000 COLONIES/mL INSIGNIFICANT GROWTH Performed at [facility], [place] [address], [place], [place] [address]    Report Status 12/10/2020 FINAL  Final  MRSA PCR Screening     Status: None   Collection Time: 12/09/20 10:03 AM   Specimen: Nasopharyngeal Swab  Result Value Ref Range Status   MRSA by PCR NEGATIVE NEGATIVE Final    Comment:        The GeneXpert MRSA Assay (FDA approved for NASAL specimens only), is one component of a comprehensive MRSA colonization surveillance program. It is not intended to diagnose MRSA infection nor to guide or monitor treatment for MRSA infections. Performed at [facility], [place] [address] [place], [place] [address]   Respiratory (~20 pathogens) panel by PCR     Status: None   Collection Time: 12/09/20  2:42 PM   Specimen: Nasopharyngeal Swab; Respiratory  Result Value Ref Range Status   Adenovirus NOT DETECTED NOT DETECTED Final   Coronavirus 229E NOT DETECTED NOT DETECTED Final    Comment: (NOTE) The Coronavirus on the Respiratory Panel, DOES NOT test for the novel  Coronavirus (2019 nCoV)  Coronavirus HKU1 NOT DETECTED NOT DETECTED Final   Coronavirus NL63 NOT DETECTED NOT DETECTED Final   Coronavirus OC43 NOT DETECTED NOT DETECTED Final   Metapneumovirus NOT DETECTED NOT DETECTED Final   Rhinovirus / Enterovirus NOT DETECTED NOT DETECTED Final   Influenza A NOT DETECTED NOT  DETECTED Final   Influenza B NOT DETECTED NOT DETECTED Final   Parainfluenza Virus 1 NOT DETECTED NOT DETECTED Final   Parainfluenza Virus 2 NOT DETECTED NOT DETECTED Final   Parainfluenza Virus 3 NOT DETECTED NOT DETECTED Final   Parainfluenza Virus 4 NOT DETECTED NOT DETECTED Final   Respiratory Syncytial Virus NOT DETECTED NOT DETECTED Final   Bordetella pertussis NOT DETECTED NOT DETECTED Final   Bordetella Parapertussis NOT DETECTED NOT DETECTED Final   Chlamydophila pneumoniae NOT DETECTED NOT DETECTED Final   Mycoplasma pneumoniae NOT DETECTED NOT DETECTED Final    Comment: Performed at [facility], [place] [address], [place], [place] [address]    Radiology Studies: No results found.    Dalaya Suppa T. [place]  If 7PM-7AM, please contact night-coverage [email or web address] 12/10/2020, 3:24 PM

## 2020-12-10 NOTE — Evaluation (Signed)
Physical Therapy Evaluation Patient Details Name: Brandy Wright MRN: [id number] DOB: 04/25/1933 Today's Date: 12/10/2020   History of Present Illness  87 y.o. female  with past medical history of asthma, dementia, GERD, and frequent falls presented to the emergency department on 12/08/2020 with confusion and shortness of breath. She reportedly had been diagnosed with pneumonia at her SNF on 2/18. On EMS arrival to SNF, o2 saturation was around 80% and she was tachycardic in the 160's. Also with BLE edema which is reportedly new.  Clinical Impression   Patient received in bed, lethargic possibly due to medication that had been given earlier to help manage medication. Needed totalAx2 for all aspects of mobility likely due to sedation, however was able to hold herself up sitting midline at EOB with general S. Daughter reports multiple falls recently but from her knowledge, Brandy Wright had been working well with PT at her SNF. Left in bed with all needs met, bed alarm active and daughter present. Recommend return to SNF once medically ready.     Follow Up Recommendations SNF;Supervision/Assistance - 24 hour;Other (comment) (return to SNF)    Equipment Recommendations  None recommended by PT    Recommendations for Other Services       Precautions / Restrictions Precautions Precautions: Fall Restrictions Weight Bearing Restrictions: No      Mobility  Bed Mobility Overal bed mobility: Needs Assistance Bed Mobility: Supine to Sit;Sit to Supine     Supine to sit: Total assist;+2 for physical assistance;+2 for safety/equipment;HOB elevated Sit to supine: Total assist;+2 for physical assistance;+2 for safety/equipment   General bed mobility comments: Total A x 2 for all bed mobility due to level of arousal. Once sitting EOB, able to maintain balance    Transfers Overall transfer level: Needs assistance Equipment used: 2 person hand held assist Transfers: Sit to/from Stand Sit to Stand:  Total assist;+2 physical assistance;+2 safety/equipment         General transfer comment: Total A x 2 for 2 standing attempts. pt feet sliding forward and unable to achieve upright posture today  Ambulation/Gait             General Gait Details: unable to attempt- lethargy  Stairs            Wheelchair Mobility    Modified Rankin (Stroke Patients Only)       Balance Overall balance assessment: Needs assistance;History of Falls Sitting-balance support: Feet supported Sitting balance-Leahy Scale: Fair Sitting balance - Comments: able to maintain static sitting EOB (initially with physical assist but progressed to close supervision)   Standing balance support: Bilateral upper extremity supported Standing balance-Leahy Scale: Zero                               Pertinent Vitals/Pain Pain Assessment: Faces Faces Pain Scale: No hurt Pain Intervention(s): Limited activity within patient's tolerance;Monitored during session    Home Living Family/patient expects to be discharged to:: Skilled nursing facility                 Additional Comments: [name], memory care    Prior Function Level of Independence: Needs assistance   Gait / Transfers Assistance Needed: mostly uses wheelchair. Had recently been walking with PT but tried on her own and had a fall recently. Was working with PT prior to current admission  ADL's / Homemaking Assistance Needed: Able to self feed, assistance for bathing, dressing, toileting. periods of incontinence  Hand Dominance   Dominant Hand: Right    Extremity/Trunk Assessment   Upper Extremity Assessment Upper Extremity Assessment: Defer to OT evaluation    Lower Extremity Assessment Lower Extremity Assessment: Generalized weakness;Difficult to assess due to impaired cognition    Cervical / Trunk Assessment Cervical / Trunk Assessment: Kyphotic  Communication   Communication: Expressive  difficulties  Cognition Arousal/Alertness: Lethargic;Suspect due to medications Behavior During Therapy: Flat affect Overall Cognitive Status: Difficult to assess                                 General Comments: hx of dementia at baseline, A&Ox1. Typically conversive and able to follow one step commands. Very lethargic today, intermittent opening of eyes (responds to name and daughter's voice inconsistently) - given Ativan earlier today due to agitation      General Comments General comments (skin integrity, edema, etc.): VSS on 2LPM O2; daughter present and supportive during session, assisted in providing PLOF information    Exercises     Assessment/Plan    PT Assessment Patient needs continued PT services  PT Problem List Decreased strength;Decreased cognition;Decreased knowledge of use of DME;Decreased activity tolerance;Decreased safety awareness;Decreased balance;Decreased mobility;Decreased coordination       PT Treatment Interventions DME instruction;Balance training;Gait training;Functional mobility training;Patient/family education;Therapeutic activities;Therapeutic exercise;Wheelchair mobility training    PT Goals (Current goals can be found in the Care Plan section)  Acute Rehab PT Goals Patient Stated Goal: daughter would like for pt to be able to wake up enough to eat and attempt walking PT Goal Formulation: With family Time For Goal Achievement: 12/24/20 Potential to Achieve Goals: Fair    Frequency Min 2X/week   Barriers to discharge        Co-evaluation   Reason for Co-Treatment: Necessary to address cognition/behavior during functional activity;For patient/therapist safety;To address functional/ADL transfers   OT goals addressed during session: ADL's and self-care       AM-PAC PT "6 Clicks" Mobility  Outcome Measure Help needed turning from your back to your side while in a flat bed without using bedrails?: Total Help needed moving from  lying on your back to sitting on the side of a flat bed without using bedrails?: Total Help needed moving to and from a bed to a chair (including a wheelchair)?: Total Help needed standing up from a chair using your arms (e.g., wheelchair or bedside chair)?: Total Help needed to walk in hospital room?: Total Help needed climbing 3-5 steps with a railing? : Total 6 Click Score: 6    End of Session Equipment Utilized During Treatment: Gait belt Activity Tolerance: Patient tolerated treatment well;Patient limited by lethargy Patient left: in bed;with call bell/phone within reach;with bed alarm set;with family/visitor present Nurse Communication: Mobility status PT Visit Diagnosis: Unsteadiness on feet (R26.81);Difficulty in walking, not elsewhere classified (R26.2);Muscle weakness (generalized) (M62.81);Repeated falls (R29.6)    Time: 1315-1340 PT Time Calculation (min) (ACUTE ONLY): 25 min   Charges:   PT Evaluation $PT Eval Moderate Complexity: 1 Mod (co-eval with OT)          Windell Norfolk, DPT, PN1   Supplemental Physical Therapist [facility]    Pager [phone] Acute Rehab Office [phone]

## 2020-12-10 NOTE — Evaluation (Signed)
Occupational Therapy Evaluation Patient Details Name: Brandy Wright MRN: [id number] DOB: [birth year] Today's Date: 12/10/2020    History of Present Illness 87 y.o. female  with past medical history of asthma, dementia, GERD, and frequent falls presented to the emergency department on 12/08/2020 with confusion and shortness of breath. She reportedly had been diagnosed with pneumonia at her SNF on 2/18. On EMS arrival to SNF, o2 saturation was around 80% and she was tachycardic in the 160's. Also with BLE edema which is reportedly new.   Clinical Impression   PTA, pt from [facility] memory care. Pt uses wheelchair for mobility primarily but has been worked with PT on mobility. Pt receives assist for ADLs. Pt presents now lethargic after receiving Ativan for agitation. Pt's daughter present, supportive and provided PLOF info. Pt overall Total A x 2 for bed mobility, but once sitting EOB, able to maintain balance without support. Pt demonstrated intermittent opening eyes to vocalizing pt name or hearing daughter's voice, unable to follow one step commands consistently today. Pt Total A x 2 for standing attempts, requires Total A for all ADLs at this time. SpO2 95% on 2 L O2. Plan to progress mobility and simple ADLs pending level of arousal. Recommend DC back to SNF.     Follow Up Recommendations  SNF;Supervision/Assistance - 24 hour (return to current SNF)    Equipment Recommendations  None recommended by OT    Recommendations for Other Services       Precautions / Restrictions Precautions Precautions: Fall Restrictions Weight Bearing Restrictions: No      Mobility Bed Mobility Overal bed mobility: Needs Assistance Bed Mobility: Supine to Sit;Sit to Supine     Supine to sit: Total assist;+2 for physical assistance;+2 for safety/equipment;HOB elevated Sit to supine: Total assist;+2 for physical assistance;+2 for safety/equipment   General bed mobility comments: Total A x 2 for  all bed mobility due to level of arousal. Once sitting EOB, able to maintain balance    Transfers Overall transfer level: Needs assistance Equipment used: 2 person hand held assist Transfers: Sit to/from Stand Sit to Stand: Total assist;+2 physical assistance;+2 safety/equipment         General transfer comment: Total A x 2 for 2 standing attempts. pt feet sliding forward and unable to achieve upright posture today    Balance Overall balance assessment: Needs assistance;History of Falls Sitting-balance support: Feet supported Sitting balance-Leahy Scale: Fair Sitting balance - Comments: able to maintain static sitting EOB (initially with physical assist but progressed to close supervision)   Standing balance support: Bilateral upper extremity supported Standing balance-Leahy Scale: Zero                             ADL either performed or assessed with clinical judgement   ADL Overall ADL's : Needs assistance/impaired Eating/Feeding: Total assistance;Bed level   Grooming: Total assistance;Bed level   Upper Body Bathing: Total assistance   Lower Body Bathing: Total assistance   Upper Body Dressing : Total assistance   Lower Body Dressing: Total assistance Lower Body Dressing Details (indicate cue type and reason): Total A for doffing/donning new socks     Toileting- Clothing Manipulation and Hygiene: Total assistance;Bed level         General ADL Comments: Total A for all ADLs at this time due to lethargy from ativan     Vision Patient Visual Report: No change from baseline Vision Assessment?: No apparent visual deficits  Perception     Praxis      Pertinent Vitals/Pain Pain Assessment: Faces Faces Pain Scale: No hurt Pain Intervention(s): Monitored during session     Hand Dominance Right   Extremity/Trunk Assessment Upper Extremity Assessment Upper Extremity Assessment: Generalized weakness;Difficult to assess due to impaired  cognition   Lower Extremity Assessment Lower Extremity Assessment: Defer to PT evaluation   Cervical / Trunk Assessment Cervical / Trunk Assessment: Normal   Communication Communication Communication: Expressive difficulties   Cognition Arousal/Alertness: Lethargic;Suspect due to medications Behavior During Therapy: Flat affect Overall Cognitive Status: Difficult to assess                                 General Comments: hx of dementia at baseline, A&Ox1. Typically conversive and able to follow one step commands. Very lethargic today, intermittent opening of eyes (responds to name and daughter's voice inconsistently) - given Ativan earlier today due to agitation   General Comments  VSS on 2 L O2 (95%). daughter present and supportive throughout session, assisted in providing PLOF information    Exercises     Shoulder Instructions      Home Living Family/patient expects to be discharged to:: Skilled nursing facility                                 Additional Comments: [name], memory care      Prior Functioning/Environment Level of Independence: Needs assistance  Gait / Transfers Assistance Needed: mostly uses wheelchair. Had recently been walking with PT but tried on her own and had a fall recently. Was working with PT prior to current admission ADL's / Homemaking Assistance Needed: Able to self feed, assistance for bathing, dressing, toileting. periods of incontinence            OT Problem List: Decreased strength;Decreased activity tolerance;Impaired balance (sitting and/or standing);Decreased cognition;Decreased safety awareness;Decreased knowledge of use of DME or AE      OT Treatment/Interventions: Self-care/ADL training;Therapeutic exercise;DME and/or AE instruction;Therapeutic activities;Patient/family education;Balance training    OT Goals(Current goals can be found in the care plan section) Acute Rehab OT Goals Patient  Stated Goal: daughter would like for pt to be able to wake up enough to eat and attempt walking OT Goal Formulation: With family Time For Goal Achievement: 12/24/20 Potential to Achieve Goals: Fair ADL Goals Pt Will Perform Eating: with min assist;sitting Pt Will Perform Grooming: with min assist;sitting Pt Will Perform Upper Body Bathing: with min assist;sitting Pt Will Perform Upper Body Dressing: with min assist;sitting Pt Will Transfer to Toilet: with min assist;stand pivot transfer;bedside commode Additional ADL Goal #1: Pt to demonstrate ability to follow one step commands during daily tasks > 75% of the time  OT Frequency: Min 2X/week   Barriers to D/C:            Co-evaluation PT/OT/SLP Co-Evaluation/Treatment: Yes Reason for Co-Treatment: Necessary to address cognition/behavior during functional activity;For patient/therapist safety;To address functional/ADL transfers   OT goals addressed during session: ADL's and self-care      AM-PAC OT "6 Clicks" Daily Activity     Outcome Measure Help from another person eating meals?: Total Help from another person taking care of personal grooming?: Total Help from another person toileting, which includes using toliet, bedpan, or urinal?: Total Help from another person bathing (including washing, rinsing, drying)?: Total Help from another person to  put on and taking off regular upper body clothing?: Total Help from another person to put on and taking off regular lower body clothing?: Total 6 Click Score: 6   End of Session Equipment Utilized During Treatment: Oxygen Nurse Communication: Mobility status  Activity Tolerance: Patient limited by lethargy;Patient limited by fatigue Patient left: in bed;with call bell/phone within reach;with bed alarm set;with family/visitor present  OT Visit Diagnosis: Unsteadiness on feet (R26.81);Other abnormalities of gait and mobility (R26.89);History of falling (Z91.81);Other symptoms and signs  involving cognitive function;Muscle weakness (generalized) (M62.81)                Time: 9276-3943 OT Time Calculation (min): 27 min Charges:  OT General Charges $OT Visit: 1 Visit OT Evaluation $OT Eval Moderate Complexity: 1 Mod  Malachy Chamber, OTR/L Acute Rehab Services Office: [phone]  Layla Maw 12/10/2020, 1:58 PM

## 2020-12-10 NOTE — Progress Notes (Signed)
Pharmacy Antibiotic Note  Brandy Wright is a 87 y.o. female admitted on 12/08/2020 with pneumonia.  Patient presents from [facility] where she was diagnosed with pneumonia last Friday.  Pharmacy has been consulted for vancomycin dosing.   Patient currently afebrile. WBC normal. Crcl ~90ml/min based on normal scr. No new culture data, MRSA PCR was negative yesterday.   Plan: Vancomycin 1g q24 hours - consider stopping with negative pcr Ceftriaxone 2g q24 hours Doxy 100mg  IV bid F/u clinical improvement, vanc levels as indicated   Height: 5\' 5"  (165.1 cm) Weight: 64.6 kg (142 lb 6.7 oz) IBW/kg (Calculated) : 57  Temp (24hrs), Avg:98 F (36.7 C), Min:97.6 F (36.4 C), Max:98.6 F (37 C)  Recent Labs  Lab 12/08/20 1503 12/08/20 1523 12/09/20 0328 12/10/20 0259  WBC 9.2  --  6.5  --   CREATININE  --  0.63 0.63 0.73  LATICACIDVEN  --  1.5  --   --     Estimated Creatinine Clearance: 46.3 mL/min (by C-G formula based on SCr of 0.73 mg/dL).    Allergies  Allergen Reactions  . Cephalexin Itching and Other (See Comments)    Tolerated cefazolin (2017) and ceftriaxone (2020) w/o problem- "Allergic," per MAR  . Penicillins Other (See Comments)    "Allergic," per MAR   . Sulfamethoxazole-Trimethoprim Other (See Comments)    "Allergic," per MAR  . Aspirin Other (See Comments)    "Allergic," per MAR  . Atrovent [Ipratropium] Other (See Comments)    Became dizzy,  when tried- "allergic," per MAR  . Qnasl [Beclomethasone] Other (See Comments)    Nasal burning- "allergic," per MAR  . Zithromax [Azithromycin] Other (See Comments)    "Allergic," per MAR  . Ciprofloxacin Diarrhea and Other (See Comments)    "Allergic," per MAR  . Pseudoephedrine Hcl Palpitations and Other (See Comments)    "allergic," per MAR    Antimicrobials this admission: Vancomycin 2/21 >>  Cefepime 2/21 >> 2/22 Ctx 2/22> Doxy 2/21>  Microbiology results: 2/21 BCx: ngtd 2/21 urine: neg  Thank  you for allowing pharmacy to be a part of this patient's care.  Erin Hearing PharmD., BCPS Clinical Pharmacist 12/10/2020 9:54 AM

## 2020-12-10 NOTE — Progress Notes (Signed)
Daily Progress Note   Patient Name: Brandy Wright       Date: 12/10/2020 DOB: [birth year]  Age: 87 y.o. MRN#: [id number] Attending Physician: Mercy Riding, MD Primary Care Physician: Patient, No Pcp Per Admit Date: 12/08/2020  Reason for Follow-up: Continued GOC discussions  Subjective: Per nursing, patient has eaten only half a cup of applesauce, only with prompting. She has been somnolent most of the day. She had an episode of significant agitation overnight.   I met with daughter Brandy Wright at bedside. Discussed that with minimal to no oral intake and overall poor prognosis with underlying malignancy, patient would be more appropriate for residential hospice as opposed to hosp[facility] care at [facility]. Gently discussed that prognosis is more likely weeks as opposed to months and residential hospice can provide continuous care at end of life as well as better symptom management.   Daughter is tearful, stating this has been difficult for her, she was hopeful to have more time. Emotional support provided. She has been keeping her siblings updated on their mother's condition, but they have not really engaged in any decision making.   Lengthy discussion was had regarding transitioning to comfort care, and what that entailed. Discussed that it means allowing her to be in a natural ad organic states and it includes keeping her clean and dry, no labs, no artificial hydration or feeding, no antibiotics, minimizing of medications, comfort feeds, and medication for pain and dyspnea. We spent time reviewing her current medication list and discussing what medications would not be continued. She expresses concern regarding stopping IV fluids. I provided education and counseling around the reasons not to use  artificial hydration at EOL, including less fluid in the lungs and extremities. Also discussed that artificial hydration prolongs the process of dying. Daughter verbalizes understanding.    Length of Stay: 2  Current Medications: Scheduled Meds:  . budesonide  0.25 mg Inhalation BID  . metoprolol tartrate  25 mg Oral BID  . morphine CONCENTRATE  5 mg Sublingual Q6H  . OLANZapine  7.5 mg Oral QHS  . senna-docusate  1 tablet Oral BID     PRN Meds: acetaminophen, albuterol, antiseptic oral rinse, glycopyrrolate **OR** glycopyrrolate, haloperidol lactate, LORazepam, ondansetron **OR** ondansetron (ZOFRAN) IV, polyvinyl alcohol  Physical Exam Vitals reviewed.  Constitutional:  General: She is not in acute distress.    Appearance: She is ill-appearing.     Comments: Somnolent Mild grimacing noted  Cardiovascular:     Rate and Rhythm: Normal rate.  Pulmonary:     Effort: Pulmonary effort is normal.  Neurological:     Mental Status: She is lethargic.     Motor: Weakness present.     Comments: Speech is unintelligible             Vital Signs: BP (!) 107/59 (BP Location: Right Arm) Comment: not. nurse  Pulse 87   Temp 98.4 F (36.9 C) (Axillary)   Resp 18   Ht '5\' 5"'  (1.651 m)   Wt 64.6 kg   SpO2 90%   BMI 23.70 kg/m  SpO2: SpO2: 90 % O2 Device: O2 Device: Nasal Cannula O2 Flow Rate: O2 Flow Rate (L/min): 2 L/min  Intake/output summary:   Intake/Output Summary (Last 24 hours) at 12/10/2020 1940 Last data filed at 12/10/2020 0700 Gross per 24 hour  Intake --  Output 150 ml  Net -150 ml   LBM:   Baseline Weight: Weight: 64.6 kg Most recent weight: Weight: 64.6 kg       Palliative Assessment/Data: PPS 10%      Palliative Care Assessment & Plan    HPI/Patient Profile: 87 y.o. female  with past medical history of asthma, dementia, GERD, and frequent falls presented to the emergency department on 12/08/2020 with confusion and shortness of breath. She  reportedly had been diagnosed with pneumonia at her SNF on 2/18. On EMS arrival to SNF, o2 saturation was around 80% and she was tachycardic in the 160's. Also with BLE edema which is reportedly new.  ED Course: Confusion turned into agitated delirium. Found to be in A fib with RVR, started on cardizem gtt. Chest x-ray showing likely pneumonia. WBC normal, no fever. BNP 306.6, troponin negative x 2, lactic acid normal. Patient was [facility] to [facility] for acute hypoxic respiratory failure, acute metabolic encephalopathy, and A-fib with RVR.  Assessment: - acute hypoxic respiratory failure due to postobstructive pneumonia in the setting of possible lung malignancy - A-fib with RVR - possible diastolic heart failure - Alzheimer's dementia - failure to thrive  Recommendations/Plan:  Full comfort measures initiated  DNR/DNI as previously documented  Transfer to [facility] in [facility] when bed becomes available - TOC consult placed; hospice liaison notifed  Added orders for symptom management at EOL as well as discontinued orders that were not focused on comfort  Unrestricted visitation orders were placed per current [place] EOL visitation policy   Spiritual care consult  Provide frequent assessments and administer PRN medications as clinically necessary to ensure EOL comfort  PMT will continue to follow holistically  Symptom Management:   Morphine concentrate solution 5 mg every 6 hours   Lorazepam (ATIVAN) prn for anxiety  Haloperidol (HALDOL) prn for agitation   Glycopyrrolate (ROBINUL) for excessive secretions  Ondansetron (ZOFRAN) prn for nausea  Polyvinyl alcohol (LIQUIFILM TEARS) prn for dry eyes  Antiseptic oral rinse (BIOTENE) prn for dry mouth  Code Status: DNR/DNI  Prognosis:   less than 2 weeks  Discharge Planning:  Hospice facility   Thank you for allowing the Palliative Medicine Team to assist in the care of this patient.   Total Time 65  minutes Prolonged Time Billed  yes      Greater than 50%  of this time was spent counseling and coordinating care related to the above assessment  and plan.  Lavena Bullion, NP  Please contact Palliative Medicine Team phone at [phone] for questions and concerns.

## 2020-12-11 MED ORDER — MORPHINE SULFATE (CONCENTRATE) 10 MG/0.5ML PO SOLN
5.0000 mg | ORAL | Status: DC
  Administered 2020-12-11 – 2020-12-16 (×27): 5 mg via SUBLINGUAL
  Filled 2020-12-11 (×28): qty 0.5

## 2020-12-11 NOTE — Progress Notes (Signed)
Daily Progress Note   Patient Name: Brandy Wright       Date: 12/11/2020 DOB: 07-08-1933  Age: 87 y.o. MRN#: [id number] Attending Physician: Mercy Riding, MD Primary Care Physician: Patient, No Pcp Per Admit Date: 12/08/2020  Reason for Follow-up: end of life care, symptom management, psychosocial support  Subjective: Patient resting with her eyes closed, but will open them intermittently per family. Mild grimacing noted. I asked if she was in pain, and she nods her head yes. She attempts verbal communication at times, but speech is mumbled and difficult to understand.  Daughter Brandy Wright and her husband (patient's sin-in-law) are at bedside. Brandy Wright reports patient had a few bites of ice cream today, otherwise no oral intake. Discussed increasing the frequency of her scheduled morphine - she agrees, stating she doesn't want her in pain. Janine reports patient complained of eye dryness - I let her know there are eye drops already ordered for that symptom and I would ask nursing to administer them. I provided Brandy Wright with a copy of "Gone from my sight"; provided counseling on expectations at EOL.   Length of Stay: 3  Current Medications: Scheduled Meds:  . budesonide  0.25 mg Inhalation BID  . metoprolol tartrate  25 mg Oral BID  . morphine CONCENTRATE  5 mg Sublingual Q6H  . OLANZapine  7.5 mg Oral QHS  . senna-docusate  1 tablet Oral BID     PRN Meds: acetaminophen, albuterol, antiseptic oral rinse, glycopyrrolate **OR** glycopyrrolate, haloperidol lactate, LORazepam, ondansetron **OR** ondansetron (ZOFRAN) IV, polyvinyl alcohol   Vital Signs: BP (!) 107/59 (BP Location: Right Arm) Comment: not. nurse  Pulse 87   Temp 98.4 F (36.9 C) (Axillary)   Resp 18   Ht 5\' 5"  (1.651 m)   Wt  64.6 kg   SpO2 98%   BMI 23.70 kg/m  SpO2: SpO2: 98 % O2 Device: O2 Device: Nasal Cannula O2 Flow Rate: O2 Flow Rate (L/min): 4 L/min (Decreased to 3 L [place])  Intake/output summary:   Intake/Output Summary (Last 24 hours) at 12/11/2020 1426 Last data filed at 12/11/2020 0254 Gross per 24 hour  Intake --  Output 350 ml  Net -350 ml        Palliative Assessment/Data: PPS 10-20%        Palliative Care Assessment & Plan  HPI/Patient Profile:87 y.o.femalewith past medical history of asthma, dementia, GERD, and frequent falls presented to the emergency departmenton 2/21/2022with confusion and shortness of breath.She reportedly had been diagnosed with pneumonia at her SNF on 2/18. On EMS arrival to SNF, o2 saturation was around 80% and she was tachycardic in the 160's. Also with BLE edema which is reportedly new.  ED Course: Confusion turned into agitated delirium. Found to be in A fib with RVR, started on cardizem gtt. Chest x-ray showing likely pneumonia. WBC normal, no fever. BNP 306.6, troponin negative x 2, lactic acid normal. Patient was admitted to [facility] for acute hypoxic respiratory failure, acute metabolic encephalopathy, and A-fib with RVR.  Assessment: - acute hypoxic respiratory failure due to postobstructive pneumonia in the setting of possible lung malignancy - A-fib with RVR - possible diastolic heart failure - Alzheimer's dementia - failure to thrive - end of life care  Recommendations/Plan:  Continue full comfort measures  DNR/DNI as previously documented  Transfer to [facility] in [facility] when bed becomes available  Increase scheduled morphine concentrate solution to 5 mg every 4 hours  Continue unrestricted visitation   Continue comfort feeds  Appreciate Spiritual Care support  Provide frequent assessments and administer PRN medications as clinically necessary to ensure EOL comfort  Please administer polyvinyl eye drops prn for eye  dryness  PMT will continue to follow holistically   Goals of Care and Additional Recommendations:  Limitations on Scope of Treatment: full comfort care  Code Status: DNR/DNI  Prognosis:  Less than 2 weeks  Discharge Planning:  Hospice house in [place] was discussed with nursing  Thank you for allowing the Palliative Medicine Team to assist in the care of this patient.   Total Time 15 minutes Prolonged Time Billed  no      Greater than 50%  of this time was spent counseling and coordinating care related to the above assessment and plan.  Lavena Bullion, NP  Please contact Palliative Medicine Team phone at [phone] for questions and concerns.

## 2020-12-11 NOTE — TOC Initial Note (Addendum)
Transition of Care [facility]) - Initial/Assessment Note    Patient Details  Name: Brandy Wright First MRN: [id number] Date of Birth: 04-Jan-1933  Transition of Care [facility]) CM/SW Contact:    Joanne Chars, LCSW Phone Number: 12/11/2020, 8:43 AM  Clinical Narrative:  Pt unable to participate in assessment.  CSW spoke with daughter [name] about referral to Hospice.  She confirms interest in [facility].  Pt is vaccinated for covid.  [name] will wait for call from [facility] [place].  CSW spoke with Cherie who will wait for bed status from her morning meeting and then reach out to daughter.      1115: Cherie does not have [place] residential bed today.  Family has declined offer of [place] residential bed.              Expected Discharge Plan: [place] Barriers to Discharge: No Barriers Identified   Patient Goals and CMS Choice   CMS Medicare.gov Compare Post Acute Care list provided to:: Patient Represenative (must comment) Choice offered to / list presented to : Adult Children  Expected Discharge Plan and Services Expected Discharge Plan: [place] In-house Referral: Clinical Social Work   Post Acute Care Choice: Hospice Living arrangements for the past 2 months: House                                      Prior Living Arrangements/Services Living arrangements for the past 2 months: [place] Lives with:: Facility Resident Patient language and need for interpreter reviewed:: No        Need for Family Participation in Patient Care: Yes (Comment) Care giver support system in place?: Yes (comment) Current home services: Other (comment) (none) Criminal Activity/Legal Involvement Pertinent to Current Situation/Hospitalization: No - Comment as needed  Activities of Daily Living Home Assistive Devices/Equipment: Wheelchair ADL Screening (condition at time of admission) Patient's cognitive  ability adequate to safely complete daily activities?: No Is the patient deaf or have difficulty hearing?: No Does the patient have difficulty seeing, even when wearing glasses/contacts?: No Does the patient have difficulty concentrating, remembering, or making decisions?: Yes Patient able to express need for assistance with ADLs?: No Does the patient have difficulty dressing or bathing?: Yes Independently performs ADLs?: No Communication: Needs assistance Is this a change from baseline?: Pre-admission baseline Dressing (OT): Dependent Is this a change from baseline?: Pre-admission baseline Grooming: Needs assistance Is this a change from baseline?: Pre-admission baseline Feeding: Dependent Is this a change from baseline?: Pre-admission baseline Bathing: Dependent Is this a change from baseline?: Pre-admission baseline Toileting: Dependent Is this a change from baseline?: Pre-admission baseline In/Out Bed: Dependent Is this a change from baseline?: Pre-admission baseline Walks in Home: Dependent Is this a change from baseline?: Pre-admission baseline Does the patient have difficulty walking or climbing stairs?: Yes Weakness of Legs: Both Weakness of Arms/Hands: Both  Permission Sought/Granted                  Emotional Assessment Appearance:: Appears stated age Attitude/Demeanor/Rapport: Unable to Assess Affect (typically observed): Unable to Assess Orientation: : Oriented to Self Alcohol / Substance Use: Not Applicable Psych Involvement: No (comment)  Admission diagnosis:  Pneumonia [J18.9] Atrial fibrillation with rapid ventricular response (HCC) [I48.91] Acute respiratory failure with hypoxia (HCC) [J96.01] Acute congestive heart failure, unspecified heart failure type [facility]) [I50.9] Patient Active Problem List  Diagnosis Date Noted  . Pneumonia 12/09/2020  . Acute metabolic encephalopathy 60/07/9322  . Atrial fibrillation with RVR ([place]) 12/08/2020  . Acute  respiratory failure with hypoxia ([place]) 12/08/2020  . Nasal congestion 06/11/2016  . Asthma, mild intermittent 06/09/2016  . Chest tightness 06/09/2016  . Cognitive changes 06/09/2016  . Cataracts, bilateral 07/10/2014  . Asthma, chronic 07/10/2014  . GERD (gastroesophageal reflux disease) 07/10/2014  . Palpitations 07/10/2014  . Tachycardia 07/10/2014   PCP:  Patient, No Pcp Per Pharmacy:   [place], [address] [place] [place] [address] Phone: [phone] Fax: [phone]     Social Determinants of Health (SDOH) Interventions    Readmission Risk Interventions No flowsheet data found.

## 2020-12-11 NOTE — Progress Notes (Signed)
PROGRESS NOTE  Brandy Wright TIW:[id number] DOB: 1933/02/01   PCP: Patient, No Pcp Per  Patient is from: SNF  DOA: 12/08/2020 LOS: 3  Chief complaints: Shortness of breath and altered mental status  Brief Narrative / Interim history: 87 year old AAF with PMH of Alzheimer's dementia, asthma, frequent falls and recent pneumonia brought to ED from SNF with AMS, hypoxia to 80% and tachycardia to 160s.  Upon arrival to ED, patient was confused, agitated and hypoxic to 80s.  She was also in A. fib with RVR and placed on Cardizem drip.  Started on broad-spectrum antibiotics for postobstructive pneumonia due to possible malignancy. Palliative medicine consulted and patient was transitioned to full comfort care after meeting with patient and patient's family.  Patient to be discharged to residential hospice in [facility] when bed available.  Subjective: Seen and examined earlier this morning. No major events overnight of this morning. No apparent distress. Lying comfortably on 4 L by [place].  Objective: Vitals:   12/10/20 0759 12/10/20 1547 12/10/20 1929 12/11/20 0921  BP:  (!) 117/54 (!) 107/59   Pulse:  81 87   Resp:  18 18   Temp:  98.6 F (37 C) 98.4 F (36.9 C)   TempSrc:   Axillary   SpO2: (!) 80% 100% 90% 98%  Weight:      Height:        Intake/Output Summary (Last 24 hours) at 12/11/2020 1822 Last data filed at 12/11/2020 0254 Gross per 24 hour  Intake -  Output 350 ml  Net -350 ml   Filed Weights   12/09/20 1507  Weight: 64.6 kg    Examination: GENERAL: No apparent distress.  Nontoxic. RESP:  No IWOB.  Fair aeration bilaterally. CVS:  RRR. Heart sounds normal.  MSK/EXT: No apparent deformity. NEURO: Sleepy. No apparent focal neuro deficits PSYCH: Calm. No distress or agitation  Procedures:  None  Microbiology summarized: COVID-19 PCR nonreactive. MRSA PCR screen negative Blood cultures NGTD Urine culture without significant growth.  Assessment &  Plan: End-of-life care/comfort measures only/DNR/DNI -Appreciate help by palliative medicine team -End-of-life pathway -Plan for transfer to residential hospice in [facility] when bed available  Acute hypoxemic respiratory failure due to postobstructive pneumonia in the setting of possible lung neoplasm as noted on CT chest.:  A. fib with RVR:   Possible diastolic heart failure  Alzheimer's dementia/delirium:  Pyuria/bacteriuria:   Hypokalemia/hypomagnesemia: Resolved  Normocytic anemia: Stable Recent Labs    04/26/20 1411 09/27/20 1538 10/08/20 0714 12/08/20 1503 12/09/20 0328  HGB 12.5 11.3* 11.4* 10.8* 10.2*   Hyperlipidemia:   Debility/physical deconditioning  Goal of care/DNR/DNI-appropriate.  Body mass index is 23.7 kg/m.         DVT prophylaxis:    Code Status: DNR/DNI Family Communication: Updated patient's daughter at bedside on 2/23. Level of care: Med-Surg Status is: Inpatient  Remains inpatient appropriate because:Unsafe d/c plan   Dispo: The patient is from: SNF              Anticipated d/c is to: Residential hospice              Anticipated d/c date is: 1 day              Patient currently is medically stable to d/c. For transfer to residential hospice   Difficult to place patient No       Consultants:  Palliative medicine   Sch Meds:  Scheduled Meds: . budesonide  0.25 mg Inhalation BID  . metoprolol  tartrate  25 mg Oral BID  . morphine CONCENTRATE  5 mg Sublingual Q4H  . OLANZapine  7.5 mg Oral QHS  . senna-docusate  1 tablet Oral BID   Continuous Infusions:  PRN Meds:.acetaminophen, albuterol, antiseptic oral rinse, glycopyrrolate **OR** glycopyrrolate, haloperidol lactate, LORazepam, ondansetron **OR** ondansetron (ZOFRAN) IV, polyvinyl alcohol  Antimicrobials: Anti-infectives (From admission, onward)   Start     Dose/Rate Route Frequency Ordered Stop   12/10/20 1600  vancomycin (VANCOREADY) IVPB 1750 mg/350 mL   Status:  Discontinued        1,750 mg 175 mL/hr over 120 Minutes Intravenous Every 48 hours 12/08/20 1637 12/09/20 1023   12/09/20 1700  vancomycin (VANCOREADY) IVPB 1000 mg/200 mL  Status:  Discontinued        1,000 mg 200 mL/hr over 60 Minutes Intravenous Every 24 hours 12/09/20 1023 12/10/20 0751   12/09/20 0500  cefTRIAXone (ROCEPHIN) 2 g in sodium chloride 0.9 % 100 mL IVPB  Status:  Discontinued        2 g 200 mL/hr over 30 Minutes Intravenous Every 24 hours 12/08/20 2126 12/10/20 1928   12/08/20 2130  doxycycline (VIBRAMYCIN) 100 mg in sodium chloride 0.9 % 250 mL IVPB  Status:  Discontinued        100 mg 125 mL/hr over 120 Minutes Intravenous Every 12 hours 12/08/20 2126 12/10/20 1928   12/08/20 1600  vancomycin (VANCOREADY) IVPB 1500 mg/300 mL        1,500 mg 150 mL/hr over 120 Minutes Intravenous  Once 12/08/20 1554 12/08/20 1906   12/08/20 1545  ceFEPIme (MAXIPIME) 2 g in sodium chloride 0.9 % 100 mL IVPB        2 g 200 mL/hr over 30 Minutes Intravenous  Once 12/08/20 1534 12/08/20 1703       I have personally reviewed the following labs and images: CBC: Recent Labs  Lab 12/08/20 1503 12/09/20 0328  WBC 9.2 6.5  HGB 10.8* 10.2*  HCT 35.4* 32.7*  MCV 83.3 83.6  PLT 435* 361   BMP &GFR Recent Labs  Lab 12/08/20 1523 12/09/20 0328 12/09/20 1526 12/10/20 0259  NA 137 135  --  139  K 3.9 3.4*  --  3.6  CL 100 99  --  104  CO2 26 23  --  24  GLUCOSE 103* 90  --  75  BUN 13 11  --  9  CREATININE 0.63 0.63  --  0.73  CALCIUM 8.9 8.1*  --  8.6*  MG  --   --  1.6* 2.2   Estimated Creatinine Clearance: 46.3 mL/min (by C-G formula based on SCr of 0.73 mg/dL). Liver & Pancreas: Recent Labs  Lab 12/08/20 1523  AST 21  ALT 17  ALKPHOS 95  BILITOT 0.5  PROT 6.7  ALBUMIN 2.7*   No results for input(s): LIPASE, AMYLASE in the last 168 hours. No results for input(s): AMMONIA in the last 168 hours. Diabetic: No results for input(s): HGBA1C in the last 72  hours. No results for input(s): GLUCAP in the last 168 hours. Cardiac Enzymes: No results for input(s): CKTOTAL, CKMB, CKMBINDEX, TROPONINI in the last 168 hours. No results for input(s): PROBNP in the last 8760 hours. Coagulation Profile: Recent Labs  Lab 12/08/20 1523  INR 1.2   Thyroid Function Tests: No results for input(s): TSH, T4TOTAL, FREET4, T3FREE, THYROIDAB in the last 72 hours. Lipid Profile: No results for input(s): CHOL, HDL, LDLCALC, TRIG, CHOLHDL, LDLDIRECT in the last 72 hours. Anemia Panel:  No results for input(s): VITAMINB12, FOLATE, FERRITIN, TIBC, IRON, RETICCTPCT in the last 72 hours. Urine analysis:    Component Value Date/Time   COLORURINE YELLOW 12/08/2020 1918   APPEARANCEUR CLEAR 12/08/2020 1918   LABSPEC 1.009 12/08/2020 1918   PHURINE 5.0 12/08/2020 1918   GLUCOSEU NEGATIVE 12/08/2020 1918   HGBUR NEGATIVE 12/08/2020 1918   BILIRUBINUR NEGATIVE 12/08/2020 1918   KETONESUR NEGATIVE 12/08/2020 1918   PROTEINUR NEGATIVE 12/08/2020 1918   NITRITE NEGATIVE 12/08/2020 1918   LEUKOCYTESUR LARGE (A) 12/08/2020 1918   Sepsis Labs: Invalid input(s): PROCALCITONIN, [place]  Microbiology: Recent Results (from the past 240 hour(s))  Resp Panel by RT-PCR (Flu A&B, Covid) Nasopharyngeal Swab     Status: None   Collection Time: 12/08/20  3:28 PM   Specimen: Nasopharyngeal Swab; Nasopharyngeal(NP) swabs in vial transport medium  Result Value Ref Range Status   SARS Coronavirus 2 by RT PCR NEGATIVE NEGATIVE Final    Comment: (NOTE) SARS-CoV-2 target nucleic acids are NOT DETECTED.  The SARS-CoV-2 RNA is generally detectable in upper respiratory specimens during the acute phase of infection. The lowest concentration of SARS-CoV-2 viral copies this assay can detect is 138 copies/mL. A negative result does not preclude SARS-Cov-2 infection and should not be used as the sole basis for treatment or other patient management decisions. A negative result may  occur with  improper specimen collection/handling, submission of specimen other than nasopharyngeal swab, presence of viral mutation(s) within the areas targeted by this assay, and inadequate number of viral copies(<138 copies/mL). A negative result must be combined with clinical observations, patient history, and epidemiological information. The expected result is Negative.  Fact Sheet for Patients:  EntrepreneurPulse.com.au  Fact Sheet for Healthcare Providers:  IncredibleEmployment.be  This test is no t yet approved or cleared by the [place] FDA and  has been authorized for detection and/or diagnosis of SARS-CoV-2 by FDA under an Emergency Use Authorization (EUA). This EUA will remain  in effect (meaning this test can be used) for the duration of the COVID-19 declaration under Section 564(b)(1) of the Act, 21 U.S.C.section 360bbb-3(b)(1), unless the authorization is terminated  or revoked sooner.       Influenza A by PCR NEGATIVE NEGATIVE Final   Influenza B by PCR NEGATIVE NEGATIVE Final    Comment: (NOTE) The Xpert Xpress SARS-CoV-2/FLU/RSV plus assay is intended as an aid in the diagnosis of influenza from Nasopharyngeal swab specimens and should not be used as a sole basis for treatment. Nasal washings and aspirates are unacceptable for Xpert Xpress SARS-CoV-2/FLU/RSV testing.  Fact Sheet for Patients: EntrepreneurPulse.com.au  Fact Sheet for Healthcare Providers: IncredibleEmployment.be  This test is not yet approved or cleared by the [place] FDA and has been authorized for detection and/or diagnosis of SARS-CoV-2 by FDA under an Emergency Use Authorization (EUA). This EUA will remain in effect (meaning this test can be used) for the duration of the COVID-19 declaration under Section 564(b)(1) of the Act, 21 U.S.C. section 360bbb-3(b)(1), unless the authorization is terminated  or revoked.  Performed at [facility], [place] [address], [place], [place] [address]   Culture, blood (routine x 2)     Status: None (Preliminary result)   Collection Time: 12/08/20  3:47 PM   Specimen: BLOOD  Result Value Ref Range Status   Specimen Description BLOOD LEFT ANTECUBITAL  Final   Special Requests   Final    BOTTLES DRAWN AEROBIC ONLY Blood Culture results may not be optimal due to an inadequate  volume of blood received in culture bottles   Culture   Final    NO GROWTH 3 DAYS Performed at [facility], [place] [address] [place], [place] [address]    Report Status PENDING  Incomplete  Culture, blood (routine x 2)     Status: None (Preliminary result)   Collection Time: 12/08/20  4:23 PM   Specimen: BLOOD  Result Value Ref Range Status   Specimen Description BLOOD SITE NOT SPECIFIED  Final   Special Requests   Final    BOTTLES DRAWN AEROBIC ONLY Blood Culture results may not be optimal due to an inadequate volume of blood received in culture bottles   Culture   Final    NO GROWTH 3 DAYS Performed at [facility], [place] [address] [place], [place] [address]    Report Status PENDING  Incomplete  Culture, Urine     Status: Abnormal   Collection Time: 12/08/20  8:10 PM   Specimen: Urine, Random  Result Value Ref Range Status   Specimen Description URINE, RANDOM  Final   Special Requests NONE  Final   Culture (A)  Final    <10,000 COLONIES/mL INSIGNIFICANT GROWTH Performed at [facility], [place] [address] [place], [place] [address]    Report Status 12/10/2020 FINAL  Final  MRSA PCR Screening     Status: None   Collection Time: 12/09/20 10:03 AM   Specimen: Nasopharyngeal Swab  Result Value Ref Range Status   MRSA by PCR NEGATIVE NEGATIVE Final    Comment:        The GeneXpert MRSA Assay (FDA approved for NASAL specimens only), is one component of a comprehensive MRSA colonization surveillance program. It is not intended to diagnose  MRSA infection nor to guide or monitor treatment for MRSA infections. Performed at [facility], [place] [address], [place], [place] [address]   Respiratory (~20 pathogens) panel by PCR     Status: None   Collection Time: 12/09/20  2:42 PM   Specimen: Nasopharyngeal Swab; Respiratory  Result Value Ref Range Status   Adenovirus NOT DETECTED NOT DETECTED Final   Coronavirus 229E NOT DETECTED NOT DETECTED Final    Comment: (NOTE) The Coronavirus on the Respiratory Panel, DOES NOT test for the novel  Coronavirus (2019 nCoV)    Coronavirus HKU1 NOT DETECTED NOT DETECTED Final   Coronavirus NL63 NOT DETECTED NOT DETECTED Final   Coronavirus OC43 NOT DETECTED NOT DETECTED Final   Metapneumovirus NOT DETECTED NOT DETECTED Final   Rhinovirus / Enterovirus NOT DETECTED NOT DETECTED Final   Influenza A NOT DETECTED NOT DETECTED Final   Influenza B NOT DETECTED NOT DETECTED Final   Parainfluenza Virus 1 NOT DETECTED NOT DETECTED Final   Parainfluenza Virus 2 NOT DETECTED NOT DETECTED Final   Parainfluenza Virus 3 NOT DETECTED NOT DETECTED Final   Parainfluenza Virus 4 NOT DETECTED NOT DETECTED Final   Respiratory Syncytial Virus NOT DETECTED NOT DETECTED Final   Bordetella pertussis NOT DETECTED NOT DETECTED Final   Bordetella Parapertussis NOT DETECTED NOT DETECTED Final   Chlamydophila pneumoniae NOT DETECTED NOT DETECTED Final   Mycoplasma pneumoniae NOT DETECTED NOT DETECTED Final    Comment: Performed at [facility], [place]. [address] [place], [place] [address]    Radiology Studies: No results found.    Taye T. [place]  If 7PM-7AM, please contact night-coverage [email or web address] 12/11/2020, 6:22 PM

## 2020-12-11 NOTE — Progress Notes (Signed)
This chaplain responded to PMT consult for EOL spiritual care.  The Pt. daughter-Janine and son in law Konrad Dolores are at the bedside.   [name] acknowledges her own tears as she participates in storytelling with the chaplain.  Janine often highlights the special relationship she has with her mother.  Konrad Dolores is present as a source of strength for Janine.   This chaplain understands [name] is coordinating communication with the siblings and is hopeful the transition to [place] happens soon. The chaplain offered Janine empathic listening and words of encouragement as she heard Janine needing support in the loss of her mother.  This chaplain is available for F/U spiritual care.

## 2020-12-12 NOTE — Progress Notes (Signed)
Palliative Medicine RN Note: Symptom check.  Daughter Brandy Wright is at bedside. Pt is initially sleeping with mumbles of 1-2 words without opening her eyes. She is on 2L O2 via [place] with some increased work of breathing. The nurse held the scheduled morphine earlier today bc she wanted the pt to be able to wake and talk to her daughter.  Brandy Wright spoke about being scared of her mother dying alone, and she spoke about today being the day she was able to give her mother permission to let go. We discussed the idea that God sends angels to accompany [place] and that none of [place] dies alone, as well as the idea that Brandy Wright has family members waiting for her in heaven.  Plan made with Brandy Wright, her RN, NP Elmer Picker, and me for pt to get morphine and Ativan to get her to sleep, then RN will place foley.  PMT will follow up tomorrow.  Brandy Skiff Henslee, RN, BSN, [facility] Palliative Medicine Team 12/12/2020 4:28 PM Office [phone]

## 2020-12-12 NOTE — Care Management Important Message (Signed)
Important Message  Patient Details  Name: Brandy Wright MRN: [id number] Date of Birth: [birth year]   Medicare Important Message Given:  Yes     Megan P Shular 12/12/2020, 2:32 PM

## 2020-12-12 NOTE — Plan of Care (Signed)

## 2020-12-12 NOTE — Progress Notes (Signed)
PROGRESS NOTE  Brandy Wright SLH:[id number] DOB: [birth year]   PCP: Patient, No Pcp Per  Patient is from: SNF  DOA: 12/08/2020 LOS: 4  Chief complaints: Shortness of breath and altered mental status  Brief Narrative / Interim history: 87 year old AAF with PMH of Alzheimer's dementia, asthma, frequent falls and recent pneumonia brought to ED from SNF with AMS, hypoxia to 80% and tachycardia to 160s.  Upon arrival to ED, patient was confused, agitated and hypoxic to 80s.  She was also in A. fib with RVR and placed on Cardizem drip.  Started on broad-spectrum antibiotics for postobstructive pneumonia due to possible malignancy. Palliative medicine consulted and patient was transitioned to full comfort care after meeting with patient and patient's family.  Patient to be discharged to residential hospice in [facility] when bed available.  Subjective: Seen and examined earlier this morning.  No major events overnight of this morning.  Sitting up in bed with eyes closed but awake.  On 1 L by nasal cannula.  No respiratory distress.  She is not oriented nor follows commands.  Objective: Vitals:   12/12/20 0840 12/12/20 1451 12/12/20 1955 12/12/20 2106  BP:  110/66  125/67  Pulse:  74  79  Resp:  14  18  Temp:  98.2 F (36.8 C)  98.1 F (36.7 C)  TempSrc:    Oral  SpO2: 94% 98% 99% 97%  Weight:      Height:        Intake/Output Summary (Last 24 hours) at 12/12/2020 2145 Last data filed at 12/11/2020 2300 Gross per 24 hour  Intake 0 ml  Output 50 ml  Net -50 ml   Filed Weights   12/09/20 1507  Weight: 64.6 kg    Examination:  GENERAL: No apparent distress. HEENT: Eyes closed.  Hearing grossly intact. RESP:  No IWOB.  On 1 L by nasal cannula. MSK/EXT: No apparent deformity. NEURO: Awake but not oriented.  No apparent focal neuro deficit. PSYCH: Calm.  No distress or agitation.  Procedures:  None  Microbiology summarized: COVID-19 PCR nonreactive. MRSA PCR screen  negative Blood cultures NGTD Urine culture without significant growth.  Assessment & Plan: End-of-life care/comfort measures only/DNR/DNI -Appreciate help by palliative medicine team -Plan for transfer to residential hospice when bed available.  Acute hypoxemic respiratory failure due to postobstructive pneumonia in the setting of possible lung neoplasm: No respiratory distress on 1 L. A. fib with RVR:  Possible diastolic heart failure Alzheimer's dementia/delirium: Pyuria/bacteriuria:  Hypokalemia/hypomagnesemia: Resolved Normocytic anemia: Stable Hyperlipidemia:  Debility/physical deconditioning Goal of care/DNR/DNI-appropriate.  Body mass index is 23.7 kg/m.         DVT prophylaxis:  None.  End-of-life care.  Code Status: DNR/DNI Family Communication: No family at bedside. Level of care: Med-Surg Status is: Inpatient  Remains inpatient appropriate because:Unsafe d/c plan   Dispo: The patient is from: SNF              Anticipated d/c is to: Residential hospice              Anticipated d/c date is: 1 day              Patient currently is medically stable to d/c. For transfer to residential hospice   Difficult to place patient No       Consultants:  Palliative medicine   Sch Meds:  Scheduled Meds: . budesonide  0.25 mg Inhalation BID  . metoprolol tartrate  25 mg Oral BID  . morphine CONCENTRATE  5 mg Sublingual Q4H  . OLANZapine  7.5 mg Oral QHS  . senna-docusate  1 tablet Oral BID   Continuous Infusions:  PRN Meds:.acetaminophen, albuterol, antiseptic oral rinse, glycopyrrolate **OR** glycopyrrolate, haloperidol lactate, LORazepam, ondansetron **OR** ondansetron (ZOFRAN) IV, polyvinyl alcohol  Antimicrobials: Anti-infectives (From admission, onward)   Start     Dose/Rate Route Frequency Ordered Stop   12/10/20 1600  vancomycin (VANCOREADY) IVPB 1750 mg/350 mL  Status:  Discontinued        1,750 mg 175 mL/hr over 120 Minutes Intravenous Every 48  hours 12/08/20 1637 12/09/20 1023   12/09/20 1700  vancomycin (VANCOREADY) IVPB 1000 mg/200 mL  Status:  Discontinued        1,000 mg 200 mL/hr over 60 Minutes Intravenous Every 24 hours 12/09/20 1023 12/10/20 0751   12/09/20 0500  cefTRIAXone (ROCEPHIN) 2 g in sodium chloride 0.9 % 100 mL IVPB  Status:  Discontinued        2 g 200 mL/hr over 30 Minutes Intravenous Every 24 hours 12/08/20 2126 12/10/20 1928   12/08/20 2130  doxycycline (VIBRAMYCIN) 100 mg in sodium chloride 0.9 % 250 mL IVPB  Status:  Discontinued        100 mg 125 mL/hr over 120 Minutes Intravenous Every 12 hours 12/08/20 2126 12/10/20 1928   12/08/20 1600  vancomycin (VANCOREADY) IVPB 1500 mg/300 mL        1,500 mg 150 mL/hr over 120 Minutes Intravenous  Once 12/08/20 1554 12/08/20 1906   12/08/20 1545  ceFEPIme (MAXIPIME) 2 g in sodium chloride 0.9 % 100 mL IVPB        2 g 200 mL/hr over 30 Minutes Intravenous  Once 12/08/20 1534 12/08/20 1703       I have personally reviewed the following labs and images: CBC: Recent Labs  Lab 12/08/20 1503 12/09/20 0328  WBC 9.2 6.5  HGB 10.8* 10.2*  HCT 35.4* 32.7*  MCV 83.3 83.6  PLT 435* 361   BMP &GFR Recent Labs  Lab 12/08/20 1523 12/09/20 0328 12/09/20 1526 12/10/20 0259  NA 137 135  --  139  K 3.9 3.4*  --  3.6  CL 100 99  --  104  CO2 26 23  --  24  GLUCOSE 103* 90  --  75  BUN 13 11  --  9  CREATININE 0.63 0.63  --  0.73  CALCIUM 8.9 8.1*  --  8.6*  MG  --   --  1.6* 2.2   Estimated Creatinine Clearance: 46.3 mL/min (by C-G formula based on SCr of 0.73 mg/dL). Liver & Pancreas: Recent Labs  Lab 12/08/20 1523  AST 21  ALT 17  ALKPHOS 95  BILITOT 0.5  PROT 6.7  ALBUMIN 2.7*   No results for input(s): LIPASE, AMYLASE in the last 168 hours. No results for input(s): AMMONIA in the last 168 hours. Diabetic: No results for input(s): HGBA1C in the last 72 hours. No results for input(s): GLUCAP in the last 168 hours. Cardiac Enzymes: No results  for input(s): CKTOTAL, CKMB, CKMBINDEX, TROPONINI in the last 168 hours. No results for input(s): PROBNP in the last 8760 hours. Coagulation Profile: Recent Labs  Lab 12/08/20 1523  INR 1.2   Thyroid Function Tests: No results for input(s): TSH, T4TOTAL, FREET4, T3FREE, THYROIDAB in the last 72 hours. Lipid Profile: No results for input(s): CHOL, HDL, LDLCALC, TRIG, CHOLHDL, LDLDIRECT in the last 72 hours. Anemia Panel: No results for input(s): VITAMINB12, FOLATE, FERRITIN, TIBC, IRON, RETICCTPCT in  the last 72 hours. Urine analysis:    Component Value Date/Time   COLORURINE YELLOW 12/08/2020 1918   APPEARANCEUR CLEAR 12/08/2020 1918   LABSPEC 1.009 12/08/2020 1918   PHURINE 5.0 12/08/2020 1918   GLUCOSEU NEGATIVE 12/08/2020 1918   HGBUR NEGATIVE 12/08/2020 1918   BILIRUBINUR NEGATIVE 12/08/2020 1918   KETONESUR NEGATIVE 12/08/2020 1918   PROTEINUR NEGATIVE 12/08/2020 1918   NITRITE NEGATIVE 12/08/2020 1918   LEUKOCYTESUR LARGE (A) 12/08/2020 1918   Sepsis Labs: Invalid input(s): PROCALCITONIN, [place]  Microbiology: Recent Results (from the past 240 hour(s))  Resp Panel by RT-PCR (Flu A&B, Covid) Nasopharyngeal Swab     Status: None   Collection Time: 12/08/20  3:28 PM   Specimen: Nasopharyngeal Swab; Nasopharyngeal(NP) swabs in vial transport medium  Result Value Ref Range Status   SARS Coronavirus 2 by RT PCR NEGATIVE NEGATIVE Final    Comment: (NOTE) SARS-CoV-2 target nucleic acids are NOT DETECTED.  The SARS-CoV-2 RNA is generally detectable in upper respiratory specimens during the acute phase of infection. The lowest concentration of SARS-CoV-2 viral copies this assay can detect is 138 copies/mL. A negative result does not preclude SARS-Cov-2 infection and should not be used as the sole basis for treatment or other patient management decisions. A negative result may occur with  improper specimen collection/handling, submission of specimen other than  nasopharyngeal swab, presence of viral mutation(s) within the areas targeted by this assay, and inadequate number of viral copies(<138 copies/mL). A negative result must be combined with clinical observations, patient history, and epidemiological information. The expected result is Negative.  Fact Sheet for Patients:  EntrepreneurPulse.com.au  Fact Sheet for Healthcare Providers:  IncredibleEmployment.be  This test is no t yet approved or cleared by the [place] FDA and  has been authorized for detection and/or diagnosis of SARS-CoV-2 by FDA under an Emergency Use Authorization (EUA). This EUA will remain  in effect (meaning this test can be used) for the duration of the COVID-19 declaration under Section 564(b)(1) of the Act, 21 U.S.C.section 360bbb-3(b)(1), unless the authorization is terminated  or revoked sooner.       Influenza A by PCR NEGATIVE NEGATIVE Final   Influenza B by PCR NEGATIVE NEGATIVE Final    Comment: (NOTE) The Xpert Xpress SARS-CoV-2/FLU/RSV plus assay is intended as an aid in the diagnosis of influenza from Nasopharyngeal swab specimens and should not be used as a sole basis for treatment. Nasal washings and aspirates are unacceptable for Xpert Xpress SARS-CoV-2/FLU/RSV testing.  Fact Sheet for Patients: EntrepreneurPulse.com.au  Fact Sheet for Healthcare Providers: IncredibleEmployment.be  This test is not yet approved or cleared by the [place] FDA and has been authorized for detection and/or diagnosis of SARS-CoV-2 by FDA under an Emergency Use Authorization (EUA). This EUA will remain in effect (meaning this test can be used) for the duration of the COVID-19 declaration under Section 564(b)(1) of the Act, 21 U.S.C. section 360bbb-3(b)(1), unless the authorization is terminated or revoked.  Performed at [facility], [place] [address] [place], [place] [address]    Culture, blood (routine x 2)     Status: None (Preliminary result)   Collection Time: 12/08/20  3:47 PM   Specimen: BLOOD  Result Value Ref Range Status   Specimen Description BLOOD LEFT ANTECUBITAL  Final   Special Requests   Final    BOTTLES DRAWN AEROBIC ONLY Blood Culture results may not be optimal due to an inadequate volume of blood received in culture bottles   Culture  Final    NO GROWTH 4 DAYS Performed at [facility], [place] [address], [place], [place] [address]    Report Status PENDING  Incomplete  Culture, blood (routine x 2)     Status: None (Preliminary result)   Collection Time: 12/08/20  4:23 PM   Specimen: BLOOD  Result Value Ref Range Status   Specimen Description BLOOD SITE NOT SPECIFIED  Final   Special Requests   Final    BOTTLES DRAWN AEROBIC ONLY Blood Culture results may not be optimal due to an inadequate volume of blood received in culture bottles   Culture   Final    NO GROWTH 4 DAYS Performed at [facility], [place] [address], [place], [place] [address]    Report Status PENDING  Incomplete  Culture, Urine     Status: Abnormal   Collection Time: 12/08/20  8:10 PM   Specimen: Urine, Random  Result Value Ref Range Status   Specimen Description URINE, RANDOM  Final   Special Requests NONE  Final   Culture (A)  Final    <10,000 COLONIES/mL INSIGNIFICANT GROWTH Performed at [facility], [place] [address], [place], [place] [address]    Report Status 12/10/2020 FINAL  Final  MRSA PCR Screening     Status: None   Collection Time: 12/09/20 10:03 AM   Specimen: Nasopharyngeal Swab  Result Value Ref Range Status   MRSA by PCR NEGATIVE NEGATIVE Final    Comment:        The GeneXpert MRSA Assay (FDA approved for NASAL specimens only), is one component of a comprehensive MRSA colonization surveillance program. It is not intended to diagnose MRSA infection nor to guide or monitor treatment for MRSA infections. Performed at [facility], [place] [address], [place], [place] [address]   Respiratory (~20 pathogens) panel by PCR     Status: None   Collection Time: 12/09/20  2:42 PM   Specimen: Nasopharyngeal Swab; Respiratory  Result Value Ref Range Status   Adenovirus NOT DETECTED NOT DETECTED Final   Coronavirus 229E NOT DETECTED NOT DETECTED Final    Comment: (NOTE) The Coronavirus on the Respiratory Panel, DOES NOT test for the novel  Coronavirus (2019 nCoV)    Coronavirus HKU1 NOT DETECTED NOT DETECTED Final   Coronavirus NL63 NOT DETECTED NOT DETECTED Final   Coronavirus OC43 NOT DETECTED NOT DETECTED Final   Metapneumovirus NOT DETECTED NOT DETECTED Final   Rhinovirus / Enterovirus NOT DETECTED NOT DETECTED Final   Influenza A NOT DETECTED NOT DETECTED Final   Influenza B NOT DETECTED NOT DETECTED Final   Parainfluenza Virus 1 NOT DETECTED NOT DETECTED Final   Parainfluenza Virus 2 NOT DETECTED NOT DETECTED Final   Parainfluenza Virus 3 NOT DETECTED NOT DETECTED Final   Parainfluenza Virus 4 NOT DETECTED NOT DETECTED Final   Respiratory Syncytial Virus NOT DETECTED NOT DETECTED Final   Bordetella pertussis NOT DETECTED NOT DETECTED Final   Bordetella Parapertussis NOT DETECTED NOT DETECTED Final   Chlamydophila pneumoniae NOT DETECTED NOT DETECTED Final   Mycoplasma pneumoniae NOT DETECTED NOT DETECTED Final    Comment: Performed at [facility], [place]. [address] [place], [place] [address]    Radiology Studies: No results found.    Laurine Kuyper T. [place]  If 7PM-7AM, please contact night-coverage [email or web address] 12/12/2020, 9:45 PM

## 2020-12-12 NOTE — Plan of Care (Signed)

## 2020-12-12 NOTE — Progress Notes (Signed)
   [facility] does not currently have a bed to offer today due to capacity. We will continue to follow and once a bed becomes available we will reach out to the transitions of care team to proceed with d/c plan. Thank you for your help in keeping pt comfortable during our waiting period. Webb Silversmith RN [phone]

## 2020-12-12 NOTE — Plan of Care (Signed)
  Problem: Education: Goal: Knowledge of General Education information will improve Description: Including pain rating scale, medication(s)/side effects and non-pharmacologic comfort measures 12/12/2020 0609 by Raynald Kemp, Klaire Court Jones Bales, RN Outcome: Not Progressing 12/12/2020 0609 by Raynald Kemp, Reiss Mowrey Jones Bales, RN Outcome: Not Progressing   Problem: Health Behavior/Discharge Planning: Goal: Ability to manage health-related needs will improve 12/12/2020 0609 by Fannye Myer Jones Bales, RN Outcome: Not Progressing 12/12/2020 0609 by Raynald Kemp, Panayiota Larkin Jones Bales, RN Outcome: Not Progressing

## 2020-12-13 DIAGNOSIS — Z789 Other specified health status: Secondary | ICD-10-CM

## 2020-12-13 DIAGNOSIS — R5381 Other malaise: Secondary | ICD-10-CM

## 2020-12-13 LAB — CULTURE, BLOOD (ROUTINE X 2)
Culture: NO GROWTH
Culture: NO GROWTH

## 2020-12-13 MED ORDER — MORPHINE SULFATE (PF) 2 MG/ML IV SOLN
1.0000 mg | INTRAVENOUS | Status: DC | PRN
  Administered 2020-12-13 – 2020-12-16 (×5): 2 mg via INTRAVENOUS
  Filled 2020-12-13 (×5): qty 1

## 2020-12-13 MED ORDER — GLYCOPYRROLATE 1 MG/5ML PO SOLN: 1.0000 mg | Freq: Four times a day (QID) | ORAL | 0 refills | Status: AC | PRN

## 2020-12-13 MED ORDER — MORPHINE SULFATE (CONCENTRATE) 10 MG /0.5 ML PO SOLN: 10.0000 mg | ORAL | 0 refills | Status: AC | PRN

## 2020-12-13 MED ORDER — LORAZEPAM 2 MG/ML IJ SOLN: 1.0000 mg | INTRAMUSCULAR | 0 refills | Status: AC | PRN

## 2020-12-13 MED ORDER — ONDANSETRON 4 MG PO TBDP: 4.0000 mg | ORAL_TABLET | Freq: Three times a day (TID) | ORAL | 0 refills | Status: AC | PRN

## 2020-12-13 NOTE — Progress Notes (Signed)
PROGRESS NOTE  Elaysia Devargas PYK:[id number] DOB: 11-Jan-1933   PCP: Patient, No Pcp Per  Patient is from: SNF  DOA: 12/08/2020 LOS: 5  Chief complaints: Shortness of breath and altered mental status  Brief Narrative / Interim history: 87 year old AAF with PMH of Alzheimer's dementia, asthma, frequent falls and recent pneumonia brought to ED from SNF with AMS, hypoxia to 80% and tachycardia to 160s.  Upon arrival to ED, patient was confused, agitated and hypoxic to 80s.  She was also in A. fib with RVR and placed on Cardizem drip.  Started on broad-spectrum antibiotics for postobstructive pneumonia due to possible malignancy. Palliative medicine consulted and patient was transitioned to full comfort care after meeting with patient and patient's family.  Patient to be discharged to residential hospice in [facility] when bed available if stable for transfer.  Subjective: Seen and examined earlier this morning.  No major events overnight on this morning.  Appears to stable without distress on 2 L by [place].  Objective: Vitals:   12/12/20 1955 12/12/20 2106 12/13/20 0812 12/13/20 1425  BP:  125/67  (!) 111/54  Pulse:  79 91 79  Resp:  18 17 14   Temp:  98.1 F (36.7 C)  97.7 F (36.5 C)  TempSrc:  Oral    SpO2: 99% 97% 95% 100%  Weight:      Height:       No intake or output data in the 24 hours ending 12/13/20 2228 Filed Weights   12/09/20 1507  Weight: 64.6 kg    Examination:  GENERAL: No apparent distress. RESP: On 2 L by [place].  No IWOB.  MSK/EXT: No apparent deformity. NEURO: Somewhat somnolent PSYCH: Calm.  No agitation.  Procedures:  None  Microbiology summarized: COVID-19 PCR nonreactive. MRSA PCR screen negative Blood cultures NGTD Urine culture without significant growth.  Assessment & Plan: End-of-life care/comfort measures only/DNR/DNI -Appreciate help by palliative medicine team -Plan for transfer to residential hospice when bed available if stable for  transfer.  Acute hypoxemic respiratory failure due to postobstructive pneumonia in the setting of possible lung neoplasm: No respiratory distress on 1 L. A. fib with RVR:  Possible diastolic heart failure Alzheimer's dementia/delirium: Pyuria/bacteriuria:  Hypokalemia/hypomagnesemia: Resolved Normocytic anemia: Stable Hyperlipidemia:  Debility/physical deconditioning Goal of care/DNR/DNI-appropriate.  Body mass index is 23.7 kg/m.         DVT prophylaxis:  None.  End-of-life care.  Code Status: DNR/DNI Family Communication: No family at bedside. Level of care: Med-Surg Status is: Inpatient  Remains inpatient appropriate because:Unsafe d/c plan   Dispo: The patient is from: SNF              Anticipated d/c is to: Residential hospice              Anticipated d/c date is: 1 day              Patient currently is medically stable to d/c.   Difficult to place patient No       Consultants:  Palliative medicine   Sch Meds:  Scheduled Meds: . morphine CONCENTRATE  5 mg Sublingual Q4H   Continuous Infusions:  PRN Meds:.acetaminophen, albuterol, antiseptic oral rinse, glycopyrrolate **OR** glycopyrrolate, haloperidol lactate, LORazepam, morphine injection, ondansetron **OR** ondansetron (ZOFRAN) IV, polyvinyl alcohol  Antimicrobials: Anti-infectives (From admission, onward)   Start     Dose/Rate Route Frequency Ordered Stop   12/10/20 1600  vancomycin (VANCOREADY) IVPB 1750 mg/350 mL  Status:  Discontinued  1,750 mg 175 mL/hr over 120 Minutes Intravenous Every 48 hours 12/08/20 1637 12/09/20 1023   12/09/20 1700  vancomycin (VANCOREADY) IVPB 1000 mg/200 mL  Status:  Discontinued        1,000 mg 200 mL/hr over 60 Minutes Intravenous Every 24 hours 12/09/20 1023 12/10/20 0751   12/09/20 0500  cefTRIAXone (ROCEPHIN) 2 g in sodium chloride 0.9 % 100 mL IVPB  Status:  Discontinued        2 g 200 mL/hr over 30 Minutes Intravenous Every 24 hours 12/08/20 2126  12/10/20 1928   12/08/20 2130  doxycycline (VIBRAMYCIN) 100 mg in sodium chloride 0.9 % 250 mL IVPB  Status:  Discontinued        100 mg 125 mL/hr over 120 Minutes Intravenous Every 12 hours 12/08/20 2126 12/10/20 1928   12/08/20 1600  vancomycin (VANCOREADY) IVPB 1500 mg/300 mL        1,500 mg 150 mL/hr over 120 Minutes Intravenous  Once 12/08/20 1554 12/08/20 1906   12/08/20 1545  ceFEPIme (MAXIPIME) 2 g in sodium chloride 0.9 % 100 mL IVPB        2 g 200 mL/hr over 30 Minutes Intravenous  Once 12/08/20 1534 12/08/20 1703       I have personally reviewed the following labs and images: CBC: Recent Labs  Lab 12/08/20 1503 12/09/20 0328  WBC 9.2 6.5  HGB 10.8* 10.2*  HCT 35.4* 32.7*  MCV 83.3 83.6  PLT 435* 361   BMP &GFR Recent Labs  Lab 12/08/20 1523 12/09/20 0328 12/09/20 1526 12/10/20 0259  NA 137 135  --  139  K 3.9 3.4*  --  3.6  CL 100 99  --  104  CO2 26 23  --  24  GLUCOSE 103* 90  --  75  BUN 13 11  --  9  CREATININE 0.63 0.63  --  0.73  CALCIUM 8.9 8.1*  --  8.6*  MG  --   --  1.6* 2.2   Estimated Creatinine Clearance: 46.3 mL/min (by C-G formula based on SCr of 0.73 mg/dL). Liver & Pancreas: Recent Labs  Lab 12/08/20 1523  AST 21  ALT 17  ALKPHOS 95  BILITOT 0.5  PROT 6.7  ALBUMIN 2.7*   No results for input(s): LIPASE, AMYLASE in the last 168 hours. No results for input(s): AMMONIA in the last 168 hours. Diabetic: No results for input(s): HGBA1C in the last 72 hours. No results for input(s): GLUCAP in the last 168 hours. Cardiac Enzymes: No results for input(s): CKTOTAL, CKMB, CKMBINDEX, TROPONINI in the last 168 hours. No results for input(s): PROBNP in the last 8760 hours. Coagulation Profile: Recent Labs  Lab 12/08/20 1523  INR 1.2   Thyroid Function Tests: No results for input(s): TSH, T4TOTAL, FREET4, T3FREE, THYROIDAB in the last 72 hours. Lipid Profile: No results for input(s): CHOL, HDL, LDLCALC, TRIG, CHOLHDL, LDLDIRECT in  the last 72 hours. Anemia Panel: No results for input(s): VITAMINB12, FOLATE, FERRITIN, TIBC, IRON, RETICCTPCT in the last 72 hours. Urine analysis:    Component Value Date/Time   COLORURINE YELLOW 12/08/2020 1918   APPEARANCEUR CLEAR 12/08/2020 1918   LABSPEC 1.009 12/08/2020 1918   PHURINE 5.0 12/08/2020 1918   GLUCOSEU NEGATIVE 12/08/2020 1918   HGBUR NEGATIVE 12/08/2020 1918   BILIRUBINUR NEGATIVE 12/08/2020 1918   KETONESUR NEGATIVE 12/08/2020 1918   PROTEINUR NEGATIVE 12/08/2020 1918   NITRITE NEGATIVE 12/08/2020 1918   LEUKOCYTESUR LARGE (A) 12/08/2020 1918   Sepsis Labs: Invalid  input(s): PROCALCITONIN, LACTICIDVEN  Microbiology: Recent Results (from the past 240 hour(s))  Resp Panel by RT-PCR (Flu A&B, Covid) Nasopharyngeal Swab     Status: None   Collection Time: 12/08/20  3:28 PM   Specimen: Nasopharyngeal Swab; Nasopharyngeal(NP) swabs in vial transport medium  Result Value Ref Range Status   SARS Coronavirus 2 by RT PCR NEGATIVE NEGATIVE Final    Comment: (NOTE) SARS-CoV-2 target nucleic acids are NOT DETECTED.  The SARS-CoV-2 RNA is generally detectable in upper respiratory specimens during the acute phase of infection. The lowest concentration of SARS-CoV-2 viral copies this assay can detect is 138 copies/mL. A negative result does not preclude SARS-Cov-2 infection and should not be used as the sole basis for treatment or other patient management decisions. A negative result may occur with  improper specimen collection/handling, submission of specimen other than nasopharyngeal swab, presence of viral mutation(s) within the areas targeted by this assay, and inadequate number of viral copies(<138 copies/mL). A negative result must be combined with clinical observations, patient history, and epidemiological information. The expected result is Negative.  Fact Sheet for Patients:  EntrepreneurPulse.com.au  Fact Sheet for Healthcare Providers:   IncredibleEmployment.be  This test is no t yet approved or cleared by the [place] FDA and  has been authorized for detection and/or diagnosis of SARS-CoV-2 by FDA under an Emergency Use Authorization (EUA). This EUA will remain  in effect (meaning this test can be used) for the duration of the COVID-19 declaration under Section 564(b)(1) of the Act, 21 U.S.C.section 360bbb-3(b)(1), unless the authorization is terminated  or revoked sooner.       Influenza A by PCR NEGATIVE NEGATIVE Final   Influenza B by PCR NEGATIVE NEGATIVE Final    Comment: (NOTE) The Xpert Xpress SARS-CoV-2/FLU/RSV plus assay is intended as an aid in the diagnosis of influenza from Nasopharyngeal swab specimens and should not be used as a sole basis for treatment. Nasal washings and aspirates are unacceptable for Xpert Xpress SARS-CoV-2/FLU/RSV testing.  Fact Sheet for Patients: EntrepreneurPulse.com.au  Fact Sheet for Healthcare Providers: IncredibleEmployment.be  This test is not yet approved or cleared by the [place] FDA and has been authorized for detection and/or diagnosis of SARS-CoV-2 by FDA under an Emergency Use Authorization (EUA). This EUA will remain in effect (meaning this test can be used) for the duration of the COVID-19 declaration under Section 564(b)(1) of the Act, 21 U.S.C. section 360bbb-3(b)(1), unless the authorization is terminated or revoked.  Performed at [facility], [place] [address] [place], [place] [address]   Culture, blood (routine x 2)     Status: None   Collection Time: 12/08/20  3:47 PM   Specimen: BLOOD  Result Value Ref Range Status   Specimen Description BLOOD LEFT ANTECUBITAL  Final   Special Requests   Final    BOTTLES DRAWN AEROBIC ONLY Blood Culture results may not be optimal due to an inadequate volume of blood received in culture bottles   Culture   Final    NO GROWTH 5 DAYS Performed  at [facility], [place] [address], [place], [place] [address]    Report Status 12/13/2020 FINAL  Final  Culture, blood (routine x 2)     Status: None   Collection Time: 12/08/20  4:23 PM   Specimen: BLOOD  Result Value Ref Range Status   Specimen Description BLOOD SITE NOT SPECIFIED  Final   Special Requests   Final    BOTTLES DRAWN AEROBIC ONLY Blood  Culture results may not be optimal due to an inadequate volume of blood received in culture bottles   Culture   Final    NO GROWTH 5 DAYS Performed at [facility], [place] [address] [place], [place] [address]    Report Status 12/13/2020 FINAL  Final  Culture, Urine     Status: Abnormal   Collection Time: 12/08/20  8:10 PM   Specimen: Urine, Random  Result Value Ref Range Status   Specimen Description URINE, RANDOM  Final   Special Requests NONE  Final   Culture (A)  Final    <10,000 COLONIES/mL INSIGNIFICANT GROWTH Performed at [facility], [place] [address] [place], [place] [address]    Report Status 12/10/2020 FINAL  Final  MRSA PCR Screening     Status: None   Collection Time: 12/09/20 10:03 AM   Specimen: Nasopharyngeal Swab  Result Value Ref Range Status   MRSA by PCR NEGATIVE NEGATIVE Final    Comment:        The GeneXpert MRSA Assay (FDA approved for NASAL specimens only), is one component of a comprehensive MRSA colonization surveillance program. It is not intended to diagnose MRSA infection nor to guide or monitor treatment for MRSA infections. Performed at [facility], [place] [address] [place], [place] [address]   Respiratory (~20 pathogens) panel by PCR     Status: None   Collection Time: 12/09/20  2:42 PM   Specimen: Nasopharyngeal Swab; Respiratory  Result Value Ref Range Status   Adenovirus NOT DETECTED NOT DETECTED Final   Coronavirus 229E NOT DETECTED NOT DETECTED Final    Comment: (NOTE) The Coronavirus on the Respiratory Panel, DOES NOT test for the novel  Coronavirus (2019 nCoV)     Coronavirus HKU1 NOT DETECTED NOT DETECTED Final   Coronavirus NL63 NOT DETECTED NOT DETECTED Final   Coronavirus OC43 NOT DETECTED NOT DETECTED Final   Metapneumovirus NOT DETECTED NOT DETECTED Final   Rhinovirus / Enterovirus NOT DETECTED NOT DETECTED Final   Influenza A NOT DETECTED NOT DETECTED Final   Influenza B NOT DETECTED NOT DETECTED Final   Parainfluenza Virus 1 NOT DETECTED NOT DETECTED Final   Parainfluenza Virus 2 NOT DETECTED NOT DETECTED Final   Parainfluenza Virus 3 NOT DETECTED NOT DETECTED Final   Parainfluenza Virus 4 NOT DETECTED NOT DETECTED Final   Respiratory Syncytial Virus NOT DETECTED NOT DETECTED Final   Bordetella pertussis NOT DETECTED NOT DETECTED Final   Bordetella Parapertussis NOT DETECTED NOT DETECTED Final   Chlamydophila pneumoniae NOT DETECTED NOT DETECTED Final   Mycoplasma pneumoniae NOT DETECTED NOT DETECTED Final    Comment: Performed at [facility], [place]. [address] [place],  [address]    Radiology Studies: No results found.    Aaria Happ T. Benson  If 7PM-7AM, please contact night-coverage [email or web address] 12/13/2020, 10:28 PM

## 2020-12-13 NOTE — Progress Notes (Addendum)
Daily Progress Note   Patient Name: Brandy Wright       Date: 12/13/2020 DOB: September 27, 1933  Age: 87 y.o. MRN#: [id number] Attending Physician: Mercy Riding, MD Primary Care Physician: Patient, No Pcp Per Admit Date: 12/08/2020  Reason for Consultation/Follow-up: Non pain symptom management, Pain control, Psychosocial/spiritual support and Terminal Care  Subjective: Chart review performed. Received report from primary RN - no acute concerns. Education provided that morphine concentrate is sublingual and okay to give regardless of patient's alertness - explained pain needs to be kept under control and that scheduled doses should be given.   Went to visit patient at bedside - daughter/Janine was present. Patient was lying in bed asleep - she does not wake to voice or gentle touch, but at times does mumble and lift her hand. No signs or non-verbal gestures of pain or discomfort noted. No respiratory distress, increased work of breathing, or secretions noted. She remains on 2L O2 [place].  Therapeutic listening and emotional support provided to [facility] as she reflected on special moments with Ms. Cyndia Diver. She also discussed the relationship with each of the patient's children/her siblings, feeling as if she does not have much available support from direct family. I provided validation that it can feel overwhelming; encouraged her to reach out to staff, PMT, or chaplain for emotional support when/if she needs it.   We discussed oxygen use at EOL and how often it can be found more uncomfortable than comforting. Discussed that it can also prolong life and prevent nature from taking it's course in the dying process. Education provided that we would support any symptoms/shortness of breath with mediations to prevent  suffering if oxygen was removed. [name] was agreeable to discontinue oxygen today. RN was present in room at this time - discussed with RN and Janine plan for weaning O2.  All questions and concerns addressed. Encouraged to call with questions and/or concerns. PMT card previously provided by Gregary Signs NP.  Length of Stay: 5  Current Medications: Scheduled Meds:  . budesonide  0.25 mg Inhalation BID  . metoprolol tartrate  25 mg Oral BID  . morphine CONCENTRATE  5 mg Sublingual Q4H  . OLANZapine  7.5 mg Oral QHS  . senna-docusate  1 tablet Oral BID    Continuous Infusions:   PRN Meds: acetaminophen, albuterol, antiseptic oral  rinse, glycopyrrolate **OR** glycopyrrolate, haloperidol lactate, LORazepam, ondansetron **OR** ondansetron (ZOFRAN) IV, polyvinyl alcohol  Physical Exam Vitals and nursing note reviewed.  Constitutional:      General: She is not in acute distress.    Appearance: She is ill-appearing.     Comments: Frail appearing  Pulmonary:     Effort: No respiratory distress.  Skin:    General: Skin is warm and dry.  Neurological:     Mental Status: She is lethargic, disoriented and confused.     Motor: Weakness present.  Psychiatric:        Speech: She is noncommunicative.        Cognition and Memory: Cognition is impaired. Memory is impaired.             Vital Signs: BP 125/67 (BP Location: Right Arm)   Pulse 91   Temp 98.1 F (36.7 C) (Oral)   Resp 17   Ht 5\' 5"  (1.651 m)   Wt 64.6 kg   SpO2 95%   BMI 23.70 kg/m  SpO2: SpO2: 95 % O2 Device: O2 Device: Nasal Cannula O2 Flow Rate: O2 Flow Rate (L/min): 2 L/min  Intake/output summary:   Intake/Output Summary (Last 24 hours) at 12/13/2020 1202 Last data filed at 12/12/2020 2000 Gross per 24 hour  Intake 0 ml  Output --  Net 0 ml   LBM:   Baseline Weight: Weight: 64.6 kg Most recent weight: Weight: 64.6 kg       Palliative Assessment/Data: PPS 10%      Patient Active Problem List   Diagnosis  Date Noted  . Pneumonia 12/09/2020  . Acute metabolic encephalopathy 63/87/5643  . Atrial fibrillation with RVR ([place]) 12/08/2020  . Acute respiratory failure with hypoxia ([place]) 12/08/2020  . Nasal congestion 06/11/2016  . Asthma, mild intermittent 06/09/2016  . Chest tightness 06/09/2016  . Cognitive changes 06/09/2016  . Cataracts, bilateral 07/10/2014  . Asthma, chronic 07/10/2014  . GERD (gastroesophageal reflux disease) 07/10/2014  . Palpitations 07/10/2014  . Tachycardia 07/10/2014    Palliative Care Assessment & Plan   Patient Profile: 87 y.o.femalewith past medical history of asthma, dementia, GERD, and frequent falls presented to the emergency departmenton 2/21/2022with confusion and shortness of breath.She reportedly had been diagnosed with pneumonia at her SNF on 2/18. On EMS arrival to SNF, o2 saturation was around 80% and she was tachycardic in the 160's. Also with BLE edema which is reportedly new.  ED Course:Confusion turned into agitated delirium. Found to be in A fib with RVR, started on cardizem gtt. Chest x-ray showing likely pneumonia. WBC normal, no fever. BNP 306.6, troponin negative x 2, lactic acid normal. Patient was admitted to [facility] for acute hypoxic respiratory failure, acute metabolic encephalopathy, and A-fib with RVR.   Assessment: Acute hypoxic respiratory failure Postobstructive pneumonia in the setting of possible lung neoplasm Atrial fibrillation with RVR Possible diastolic heart failure Alzheimer's dementia Physical deconditioning Terminal care   Recommendations/Plan:  Continue full comfort measures - prognosis likely days, maybe hours as oxygen is weaned  Continue DNR/DNI as previously documented  Transfer to [facility] when bed becomes available  Continue current comfort focused medication regimen - patient appears comfortable  Wean to room air  Added morphine 1-2mg  IV PRN to ensure dyspnea is managed as O2 weaned    Provide frequent assessments and administer PRN medications as clinically necessary to ensure EOL comfort  PMT will continue to follow and support holistically   Goals of Care and Additional  Recommendations:  Limitations on Scope of Treatment: Full Comfort Care  Code Status:    Code Status Orders  (From admission, onward)         Start     Ordered   12/10/20 1929  Do not attempt resuscitation (DNR)  Continuous       Question Answer Comment  In the event of cardiac or respiratory ARREST Do not call a "code blue"   In the event of cardiac or respiratory ARREST Do not perform Intubation, CPR, defibrillation or ACLS   In the event of cardiac or respiratory ARREST Use medication by any route, position, wound care, and other measures to relive pain and suffering. May use oxygen, suction and manual treatment of airway obstruction as needed for comfort.   Comments Confirmed with family      12/10/20 1931        Code Status History    Date Active Date Inactive Code Status Order ID Comments User Context   12/08/2020 2126 12/10/2020 1931 DNR [id number]  Etta Quill, DO ED   Advance Care Planning Activity       Prognosis:   Hours - Days  Discharge Planning:  Hospice facility  Care plan was discussed with primary RN, Dr. Cyndia Skeeters, [facility], patient's daughter  Thank you for allowing the Palliative Medicine Team to assist in the care of this patient.   Total Time 32 minutes Prolonged Time Billed  no       Greater than 50%  of this time was spent counseling and coordinating care related to the above assessment and plan.  Lin Landsman, NP  Please contact Palliative Medicine Team phone at [phone] for questions and concerns.

## 2020-12-13 NOTE — Discharge Summary (Signed)
Physician Discharge Summary  Brandy Wright OZD:[id number] DOB: 1933-04-04 DOA: 12/08/2020  PCP: Patient, No Pcp Per  Admit date: 12/08/2020 Discharge date: 12/16/2020  Admitted From: SNF Disposition: Residential hospice  Discharge Condition: Stable for transfer CODE STATUS: DNR/DNI   Hospital Course: 87 year old AAF with PMH of Alzheimer's dementia, asthma, frequent falls and recent pneumonia brought to ED from SNF with AMS, hypoxia to 80% and tachycardia to 160s.  Upon arrival to ED, patient was confused, agitated and hypoxic to 80s.  She was also in A. fib with RVR and placed on Cardizem drip.  Started on broad-spectrum antibiotics for postobstructive pneumonia due to possible malignancy. Palliative medicine consulted and patient was transitioned to full comfort care after meeting with patient and patient's family.  She waited for few days for a bed at hospice facility which has been obtained today so she is being discharged today.  Discharge Diagnoses:  End-of-life care/comfort measures only/DNR/DNI -Transferred to residential hospice -Rx for Roxanol, Ativan, Zofran and glycopyrrolate as below.  Acute hypoxemic respiratory failure due to postobstructive pneumonia in the setting of possible lung neoplasm: No respiratory distress on 1 L. A. fib with RVR:  Possible diastolic heart failure Alzheimer's dementia/delirium: Pyuria/bacteriuria:  Hypokalemia/hypomagnesemia: Resolved Normocytic anemia: Stable Hyperlipidemia:  Debility/physical deconditioning Goal of care/DNR/DNI-appropriate.   Body mass index is 23.7 kg/m.            Discharge Exam: Vitals:   12/14/20 2008 12/15/20 0812  BP: 119/70 113/64  Pulse: (!) 108 (!) 104  Resp: 20 20  Temp: 98.8 F (37.1 C)   SpO2: (!) 83% (!) 62%    GENERAL: No apparent distress. HEENT: Eyes closed.   RESP:  No IWOB.  On 1 L by nasal cannula. MSK/EXT: No apparent deformity.   Discharge Instructions   Allergies as of  12/16/2020      Reactions   Cephalexin Itching, Other (See Comments)   Tolerated cefazolin (2017) and ceftriaxone (2020) w/o problem- "Allergic," per [facility]   Penicillins Other (See Comments)   "Allergic," per [facility]   Sulfamethoxazole-trimethoprim Other (See Comments)   "Allergic," per [facility]   Aspirin Other (See Comments)   "Allergic," per [facility]   Atrovent [ipratropium] Other (See Comments)   Became dizzy,  when tried- "allergic," per [facility]   Qnasl [beclomethasone] Other (See Comments)   Nasal burning- "allergic," per [facility]   Zithromax [azithromycin] Other (See Comments)   "Allergic," per [facility]   Ciprofloxacin Diarrhea, Other (See Comments)   "Allergic," per [facility]   Pseudoephedrine Hcl Palpitations, Other (See Comments)   "allergic," per [facility]      Medication List    STOP taking these medications   acetaminophen 500 MG tablet Commonly known as: TYLENOL   albuterol 108 (90 Base) MCG/ACT inhaler Commonly known as: VENTOLIN HFA   atorvastatin 10 MG tablet Commonly known as: LIPITOR   cetirizine 10 MG tablet Commonly known as: ZYRTEC   fluticasone 220 MCG/ACT inhaler Commonly known as: FLOVENT HFA   loperamide 2 MG capsule Commonly known as: IMODIUM   magnesium hydroxide 400 MG/5ML suspension Commonly known as: MILK OF MAGNESIA   melatonin 3 MG Tabs tablet   Mi-Acid [phone] MG/5ML suspension Generic drug: alum & mag hydroxide-simeth   neomycin-bacitracin-polymyxin [phone] ointment   OLANZapine 7.5 MG tablet Commonly known as: ZYPREXA   Robafen 100 MG/5ML syrup Generic drug: guaifenesin   sennosides-docusate sodium 8.6-50 MG tablet Commonly known as: SENOKOT-S   sertraline 100 MG tablet Commonly known as: ZOLOFT   Vitamin D-3 25  MCG (1000 UT) Caps     TAKE these medications   Glycopyrrolate 1 MG/5ML Soln Take 5 mLs (1 mg total) by mouth 4 (four) times daily as needed for up to 3 days.   LORazepam 2 MG/ML injection Commonly known as: ATIVAN Inject 0.5 mLs (1 mg  total) into the vein every 4 (four) hours as needed.   morphine CONCENTRATE 10 mg / 0.5 ml concentrated solution Take 0.5 mLs (10 mg total) by mouth every 3 (three) hours as needed for moderate pain, severe pain or shortness of breath.   ondansetron 4 MG disintegrating tablet Commonly known as: Zofran ODT Take 1 tablet (4 mg total) by mouth every 8 (eight) hours as needed for nausea or vomiting.       Consultations:  Palliative medicine  Procedures/Studies:   CT CHEST W CONTRAST  Result Date: 12/09/2020 CLINICAL DATA:  Pneumonia, effusion or abscess suspected. EXAM: CT CHEST WITH CONTRAST TECHNIQUE: Multidetector CT imaging of the chest was performed during intravenous contrast administration. CONTRAST:  50mL OMNIPAQUE IOHEXOL 300 MG/ML  SOLN COMPARISON:  October 15, 2019 FINDINGS: Cardiovascular: Mild to moderate severity calcification of the aortic arch is noted. Normal heart size. There is a small pericardial effusion. Mediastinum/Nodes: A stable ill-defined area of soft tissue attenuation is noted along the left hilum with partial occlusion of the left mainstem bronchus. Thyroid gland, trachea, and esophagus demonstrate no significant findings. Lungs/Pleura: Stable moderate to marked severity areas of scarring and/or atelectasis are seen within the posteromedial aspect of the right apex and inferior aspect of the left upper lobe. Mild to moderate severity right lower lobe and moderate to marked severity left lower lobe atelectasis and/or infiltrate is seen. There is a small to moderate sized partially loculated right pleural effusion. A large left pleural effusion is also noted. No pneumothorax is identified. Upper Abdomen: No acute abnormality. Musculoskeletal: No chest wall abnormality. No acute or significant osseous findings. IMPRESSION: 1. Area of ill-defined soft tissue attenuation within the region of the left hilum with partial occlusion of the left mainstem bronchus and complete  occlusion of the left upper lobe and lower lobe lung airways. This is increased in size when compared to the prior study. Sequelae associated with an underlying neoplasm cannot be excluded. Further evaluation with nuclear medicine PET/CT is recommended. 2. Stable moderate to marked severity areas of right apical and left upper lobe scarring and/or atelectasis. 3. Mild to moderate severity right lower lobe and moderate to marked severity left lower lobe atelectasis and/or infiltrate. Follow-up to resolution is recommended to exclude the presence of an underlying neoplastic process. 4. Bilateral pleural effusions, left larger than right. 5. Small pericardial effusion. 6. Aortic atherosclerosis. Aortic Atherosclerosis (ICD10-I70.0). Electronically Signed   By: Virgina Norfolk M.D.   On: 12/09/2020 02:47   DG Chest Port 1 View  Result Date: 12/08/2020 CLINICAL DATA:  Short of breath EXAM: PORTABLE CHEST 1 VIEW COMPARISON:  04/26/2020, CT chest 10/15/2019 FINDINGS: Volume loss left hemithorax with shift of mediastinal contents to the left. Increased left pleural disease or thickening. Redemonstrated left perihilar opacity with new or worsened consolidation at the left base. Cardiomediastinal silhouette largely obscured. Aortic atherosclerosis. Patchy opacities in the right thorax. IMPRESSION: 1. Worsening aeration with new or worsened consolidation at the left base which may be due to pneumonia or postobstructive change. 2. Increased left pleural disease/pleural thickening with shift of mediastinal contents to the left. Persistent left perihilar opacity. 3. Patchy opacities in the right thorax may reflect diffuse  foci of infection Electronically Signed   By: Donavan Foil M.D.   On: 12/08/2020 15:51   ECHOCARDIOGRAM COMPLETE  Result Date: 12/09/2020    ECHOCARDIOGRAM REPORT   Patient Name:   [facility] Date of Exam: 12/09/2020 Medical Rec #:  [id number]       Height:       62.0 in Accession #:    [id number]       Weight:       113.0 lb Date of Birth:  [birth year]        BSA:          1.500 m Patient Age:    51 years        BP:           115/63 mmHg Patient Gender: F               HR:           83 bpm. Exam Location:  Inpatient Procedure: 2D Echo, Cardiac Doppler and Color Doppler Indications:    I48.0 Paroxysmal atrial fibrillation  History:        Patient has no prior history of Echocardiogram examinations.                 Risk Factors:GERD.  Sonographer:    Jonelle Sidle Dance Referring Phys: [place]  1. Left ventricular ejection fraction, by estimation, is 60 to 65%. The left ventricle has normal function. The left ventricle has no regional wall motion abnormalities. Left ventricular diastolic parameters are indeterminate.  2. Right ventricular systolic function is mildly reduced. The right ventricular size is normal. There is mildly elevated pulmonary artery systolic pressure.  3. Left atrial size was moderately dilated.  4. Right atrial size was moderately dilated.  5. A small pericardial effusion is present. There is no evidence of cardiac tamponade.  6. The mitral valve is normal in structure. Trivial mitral valve regurgitation. No evidence of mitral stenosis.  7. The aortic valve is tricuspid. There is mild calcification of the aortic valve. There is mild thickening of the aortic valve. Aortic valve regurgitation is trivial. Mild aortic valve sclerosis is present, with no evidence of aortic valve stenosis.  8. The inferior vena cava is dilated in size with <50% respiratory variability, suggesting right atrial pressure of 15 mmHg. Comparison(s): No prior Echocardiogram. Conclusion(s)/Recommendation(s): Small pericardial effusion without evidence of tamponade by echo. FINDINGS  Left Ventricle: Left ventricular ejection fraction, by estimation, is 60 to 65%. The left ventricle has normal function. The left ventricle has no regional wall motion abnormalities. The left ventricular internal cavity size  was normal in size. There is  no left ventricular hypertrophy. Left ventricular diastolic parameters are indeterminate. Right Ventricle: The right ventricular size is normal. Right vetricular wall thickness was not well visualized. Right ventricular systolic function is mildly reduced. There is mildly elevated pulmonary artery systolic pressure. The tricuspid regurgitant velocity is 2.63 m/s, and with an assumed right atrial pressure of 15 mmHg, the estimated right ventricular systolic pressure is 78.6 mmHg. Left Atrium: Left atrial size was moderately dilated. Right Atrium: Right atrial size was moderately dilated. Pericardium: A small pericardial effusion is present. There is no evidence of cardiac tamponade. Mitral Valve: The mitral valve is normal in structure. Mild to moderate mitral annular calcification. Trivial mitral valve regurgitation. No evidence of mitral valve stenosis. Tricuspid Valve: The tricuspid valve is normal in structure. Tricuspid valve regurgitation is trivial. No evidence of tricuspid stenosis. Aortic Valve:  The aortic valve is tricuspid. There is mild calcification of the aortic valve. There is mild thickening of the aortic valve. Aortic valve regurgitation is trivial. Mild aortic valve sclerosis is present, with no evidence of aortic valve stenosis. Pulmonic Valve: The pulmonic valve was grossly normal. Pulmonic valve regurgitation is trivial. No evidence of pulmonic stenosis. Aorta: The aortic root, ascending aorta, aortic arch and descending aorta are all structurally normal, with no evidence of dilitation or obstruction. Venous: The inferior vena cava is dilated in size with less than 50% respiratory variability, suggesting right atrial pressure of 15 mmHg. IAS/Shunts: The atrial septum is grossly normal. Additional Comments: There is a small pleural effusion in both left and right lateral regions.  LEFT VENTRICLE PLAX 2D LVIDd:         3.70 cm  Diastology LVIDs:         2.40 cm  LV e'  medial:    9.03 cm/s LV PW:         1.00 cm  LV E/e' medial:  11.5 LV IVS:        0.90 cm  LV e' lateral:   7.94 cm/s LVOT diam:     2.00 cm  LV E/e' lateral: 13.1 LV SV:         66 LV SV Index:   44 LVOT Area:     3.14 cm  RIGHT VENTRICLE            IVC RV Basal diam:  2.80 cm    IVC diam: 2.20 cm RV S prime:     9.36 cm/s TAPSE (M-mode): 1.3 cm LEFT ATRIUM             Index       RIGHT ATRIUM           Index LA diam:        4.20 cm 2.80 cm/m  RA Area:     16.20 cm LA Vol (A2C):   55.7 ml 37.14 ml/m RA Volume:   45.30 ml  30.20 ml/m LA Vol (A4C):   68.1 ml 45.41 ml/m LA Biplane Vol: 61.6 ml 41.07 ml/m  AORTIC VALVE LVOT Vmax:   102.00 cm/s LVOT Vmean:  68.600 cm/s LVOT VTI:    0.209 m  AORTA Ao Root diam: 3.20 cm Ao Asc diam:  3.40 cm MITRAL VALVE                TRICUSPID VALVE MV Area (PHT): 3.37 cm     TR Peak grad:   27.7 mmHg MV Decel Time: 225 msec     TR Vmax:        263.00 cm/s MV E velocity: 104.00 cm/s MV A velocity: 51.40 cm/s   SHUNTS MV E/A ratio:  2.02         Systemic VTI:  0.21 m                             Systemic Diam: 2.00 cm Buford Dresser MD Electronically signed by Buford Dresser MD Signature Date/Time: 12/09/2020/3:14:32 PM    Final        The results of significant diagnostics from this hospitalization (including imaging, microbiology, ancillary and laboratory) are listed below for reference.     Microbiology: Recent Results (from the past 240 hour(s))  Resp Panel by RT-PCR (Flu A&B, Covid) Nasopharyngeal Swab     Status: None   Collection Time: 12/08/20  3:28 PM  Specimen: Nasopharyngeal Swab; Nasopharyngeal(NP) swabs in vial transport medium  Result Value Ref Range Status   SARS Coronavirus 2 by RT PCR NEGATIVE NEGATIVE Final    Comment: (NOTE) SARS-CoV-2 target nucleic acids are NOT DETECTED.  The SARS-CoV-2 RNA is generally detectable in upper respiratory specimens during the acute phase of infection. The lowest concentration of SARS-CoV-2 viral  copies this assay can detect is 138 copies/mL. A negative result does not preclude SARS-Cov-2 infection and should not be used as the sole basis for treatment or other patient management decisions. A negative result may occur with  improper specimen collection/handling, submission of specimen other than nasopharyngeal swab, presence of viral mutation(s) within the areas targeted by this assay, and inadequate number of viral copies(<138 copies/mL). A negative result must be combined with clinical observations, patient history, and epidemiological information. The expected result is Negative.  Fact Sheet for Patients:  EntrepreneurPulse.com.au  Fact Sheet for Healthcare Providers:  IncredibleEmployment.be  This test is no t yet approved or cleared by the [place] FDA and  has been authorized for detection and/or diagnosis of SARS-CoV-2 by FDA under an Emergency Use Authorization (EUA). This EUA will remain  in effect (meaning this test can be used) for the duration of the COVID-19 declaration under Section 564(b)(1) of the Act, 21 U.S.C.section 360bbb-3(b)(1), unless the authorization is terminated  or revoked sooner.       Influenza A by PCR NEGATIVE NEGATIVE Final   Influenza B by PCR NEGATIVE NEGATIVE Final    Comment: (NOTE) The Xpert Xpress SARS-CoV-2/FLU/RSV plus assay is intended as an aid in the diagnosis of influenza from Nasopharyngeal swab specimens and should not be used as a sole basis for treatment. Nasal washings and aspirates are unacceptable for Xpert Xpress SARS-CoV-2/FLU/RSV testing.  Fact Sheet for Patients: EntrepreneurPulse.com.au  Fact Sheet for Healthcare Providers: IncredibleEmployment.be  This test is not yet approved or cleared by the [place] FDA and has been authorized for detection and/or diagnosis of SARS-CoV-2 by FDA under an Emergency Use Authorization (EUA). This  EUA will remain in effect (meaning this test can be used) for the duration of the COVID-19 declaration under Section 564(b)(1) of the Act, 21 U.S.C. section 360bbb-3(b)(1), unless the authorization is terminated or revoked.  Performed at [facility], [place] [address] [place], [place] [address]   Culture, blood (routine x 2)     Status: None   Collection Time: 12/08/20  3:47 PM   Specimen: BLOOD  Result Value Ref Range Status   Specimen Description BLOOD LEFT ANTECUBITAL  Final   Special Requests   Final    BOTTLES DRAWN AEROBIC ONLY Blood Culture results may not be optimal due to an inadequate volume of blood received in culture bottles   Culture   Final    NO GROWTH 5 DAYS Performed at [facility], [place] [address], [place], [place] [address]    Report Status 12/13/2020 FINAL  Final  Culture, blood (routine x 2)     Status: None   Collection Time: 12/08/20  4:23 PM   Specimen: BLOOD  Result Value Ref Range Status   Specimen Description BLOOD SITE NOT SPECIFIED  Final   Special Requests   Final    BOTTLES DRAWN AEROBIC ONLY Blood Culture results may not be optimal due to an inadequate volume of blood received in culture bottles   Culture   Final    NO GROWTH 5 DAYS Performed at [facility], [place]  [address], [place], [place] [address]    Report Status 12/13/2020 FINAL  Final  Culture, Urine     Status: Abnormal   Collection Time: 12/08/20  8:10 PM   Specimen: Urine, Random  Result Value Ref Range Status   Specimen Description URINE, RANDOM  Final   Special Requests NONE  Final   Culture (A)  Final    <10,000 COLONIES/mL INSIGNIFICANT GROWTH Performed at [facility], [place] [address] [place], [place] [address]    Report Status 12/10/2020 FINAL  Final  MRSA PCR Screening     Status: None   Collection Time: 12/09/20 10:03 AM   Specimen: Nasopharyngeal Swab  Result Value Ref Range Status   MRSA by PCR NEGATIVE NEGATIVE Final    Comment:        The  GeneXpert MRSA Assay (FDA approved for NASAL specimens only), is one component of a comprehensive MRSA colonization surveillance program. It is not intended to diagnose MRSA infection nor to guide or monitor treatment for MRSA infections. Performed at [facility], [place] [address], [place], [place] [address]   Respiratory (~20 pathogens) panel by PCR     Status: None   Collection Time: 12/09/20  2:42 PM   Specimen: Nasopharyngeal Swab; Respiratory  Result Value Ref Range Status   Adenovirus NOT DETECTED NOT DETECTED Final   Coronavirus 229E NOT DETECTED NOT DETECTED Final    Comment: (NOTE) The Coronavirus on the Respiratory Panel, DOES NOT test for the novel  Coronavirus (2019 nCoV)    Coronavirus HKU1 NOT DETECTED NOT DETECTED Final   Coronavirus NL63 NOT DETECTED NOT DETECTED Final   Coronavirus OC43 NOT DETECTED NOT DETECTED Final   Metapneumovirus NOT DETECTED NOT DETECTED Final   Rhinovirus / Enterovirus NOT DETECTED NOT DETECTED Final   Influenza A NOT DETECTED NOT DETECTED Final   Influenza B NOT DETECTED NOT DETECTED Final   Parainfluenza Virus 1 NOT DETECTED NOT DETECTED Final   Parainfluenza Virus 2 NOT DETECTED NOT DETECTED Final   Parainfluenza Virus 3 NOT DETECTED NOT DETECTED Final   Parainfluenza Virus 4 NOT DETECTED NOT DETECTED Final   Respiratory Syncytial Virus NOT DETECTED NOT DETECTED Final   Bordetella pertussis NOT DETECTED NOT DETECTED Final   Bordetella Parapertussis NOT DETECTED NOT DETECTED Final   Chlamydophila pneumoniae NOT DETECTED NOT DETECTED Final   Mycoplasma pneumoniae NOT DETECTED NOT DETECTED Final    Comment: Performed at [facility], [place]. [address] [place],  [address]     Labs:  CBC: No results for input(s): WBC, NEUTROABS, HGB, HCT, MCV, PLT in the last 168 hours. BMP &GFR Recent Labs  Lab 12/09/20 1526 12/10/20 0259  NA  --  139  K  --  3.6  CL  --  104  CO2  --  24  GLUCOSE  --  75  BUN  --  9   CREATININE  --  0.73  CALCIUM  --  8.6*  MG 1.6* 2.2   Estimated Creatinine Clearance: 46.3 mL/min (by C-G formula based on SCr of 0.73 mg/dL). Liver & Pancreas: No results for input(s): AST, ALT, ALKPHOS, BILITOT, PROT, ALBUMIN in the last 168 hours. No results for input(s): LIPASE, AMYLASE in the last 168 hours. No results for input(s): AMMONIA in the last 168 hours. Diabetic: No results for input(s): HGBA1C in the last 72 hours. No results for input(s): GLUCAP in the last 168 hours. Cardiac Enzymes: No results for input(s): CKTOTAL, CKMB, CKMBINDEX, TROPONINI  in the last 168 hours. No results for input(s): PROBNP in the last 8760 hours. Coagulation Profile: No results for input(s): INR, PROTIME in the last 168 hours. Thyroid Function Tests: No results for input(s): TSH, T4TOTAL, FREET4, T3FREE, THYROIDAB in the last 72 hours. Lipid Profile: No results for input(s): CHOL, HDL, LDLCALC, TRIG, CHOLHDL, LDLDIRECT in the last 72 hours. Anemia Panel: No results for input(s): VITAMINB12, FOLATE, FERRITIN, TIBC, IRON, RETICCTPCT in the last 72 hours. Urine analysis:    Component Value Date/Time   COLORURINE YELLOW 12/08/2020 1918   APPEARANCEUR CLEAR 12/08/2020 1918   LABSPEC 1.009 12/08/2020 1918   PHURINE 5.0 12/08/2020 1918   GLUCOSEU NEGATIVE 12/08/2020 1918   HGBUR NEGATIVE 12/08/2020 1918   BILIRUBINUR NEGATIVE 12/08/2020 1918   KETONESUR NEGATIVE 12/08/2020 1918   PROTEINUR NEGATIVE 12/08/2020 1918   NITRITE NEGATIVE 12/08/2020 1918   LEUKOCYTESUR LARGE (A) 12/08/2020 1918   Sepsis Labs: Invalid input(s): PROCALCITONIN, LACTICIDVEN   Time coordinating discharge: 25 minutes  SIGNED:  Darliss Cheney, MD  [facility] 12/16/2020, 2:07 PM  If 7PM-7AM, please contact night-coverage [email or web address]

## 2020-12-14 DIAGNOSIS — R0602 Shortness of breath: Secondary | ICD-10-CM

## 2020-12-14 NOTE — Progress Notes (Signed)
Daily Progress Note   Patient Name: Brandy Wright       Date: 12/14/2020 DOB: 05-Mar-1933  Age: 87 y.o. MRN#: [id number] Attending Physician: Darliss Cheney, MD Primary Care Physician: Patient, No Pcp Per Admit Date: 12/08/2020  Reason for Consultation/Follow-up: Non pain symptom management, Pain control, Psychosocial/spiritual support and Terminal Care  Subjective: Chart review performed. Received report from primary RN - no acute concerns.  Went to visit patient at bedside - daughter/Janine was present. Patient was lying in bed asleep - she does not wake to voice/gentle touch. No signs or non-verbal gestures of pain or discomfort noted. No respiratory distress or secretions noted; however respiratory rate is 25 and breaths are shallow. Patient has been weaned to RA.  Therapeutic listening and emotional support offered to [facility]. She expresses how removing Ms. Gitto off oxygen yesterday was hard for her - validation provided. Janine reflects on the patient's pulse ox readings - we discuss how "numbers" at end of life are not indicative to when a person might pass away; rather, we look at what the body is telling us. I explain that at this time I feel we can better manage her breathing to provide a more relaxed state. Discussed that it was time for patient to receive scheduled dose of morphine, but if no improvement was noted I recommend giving PRN dose of morphine - [name] is agreeable and I explained I would let RN know the plan/what to assess for.   All questions and concerns addressed. Encouraged to call with questions and/or concerns. PMT card previously provided.  Spoke with RN and provided update for plan to manage respirations.    Length of Stay: 6  Current Medications: Scheduled  Meds:  . morphine CONCENTRATE  5 mg Sublingual Q4H    Continuous Infusions:   PRN Meds: acetaminophen, albuterol, antiseptic oral rinse, glycopyrrolate **OR** glycopyrrolate, haloperidol lactate, LORazepam, morphine injection, ondansetron **OR** ondansetron (ZOFRAN) IV, polyvinyl alcohol  Physical Exam Vitals and nursing note reviewed.  Constitutional:      General: She is not in acute distress.    Appearance: She is ill-appearing.  Pulmonary:     Effort: No respiratory distress.  Skin:    General: Skin is warm and dry.  Neurological:     Mental Status: She is lethargic.     Motor: Weakness present.  Psychiatric:        Speech: She is noncommunicative.             Vital Signs: BP 118/66   Pulse 79   Temp 97.7 F (36.5 C) (Axillary)   Resp 16   Ht 5\' 5"  (1.651 m)   Wt 64.6 kg   SpO2 (!) 84%   BMI 23.70 kg/m  SpO2: SpO2: (!) 84 % O2 Device: O2 Device: Nasal Cannula O2 Flow Rate: O2 Flow Rate (L/min): 2 L/min  Intake/output summary:   Intake/Output Summary (Last 24 hours) at 12/14/2020 1219 Last data filed at 12/14/2020 0500 Gross per 24 hour  Intake --  Output 350 ml  Net -350 ml   LBM: Last BM Date: 12/10/20 Baseline Weight: Weight: 64.6 kg Most recent weight: Weight: 64.6 kg       Palliative Assessment/Data: PPS 10%      Patient Active Problem List   Diagnosis Date Noted  . Pneumonia 12/09/2020  . Acute metabolic encephalopathy 13/05/6577  . Atrial fibrillation with RVR ([place]) 12/08/2020  . Acute respiratory failure with hypoxia ([place]) 12/08/2020  . Nasal congestion 06/11/2016  . Asthma, mild intermittent 06/09/2016  . Chest tightness 06/09/2016  . Cognitive changes 06/09/2016  . Cataracts, bilateral 07/10/2014  . Asthma, chronic 07/10/2014  . GERD (gastroesophageal reflux disease) 07/10/2014  . Palpitations 07/10/2014  . Tachycardia 07/10/2014    Palliative Care Assessment & Plan   Patient Profile: 87 y.o.femalewith past medical history  of asthma, dementia, GERD, and frequent falls presented to the emergency departmenton 2/21/2022with confusion and shortness of breath.She reportedly had been diagnosed with pneumonia at her SNF on 2/18. On EMS arrival to SNF, o2 saturation was around 80% and she was tachycardic in the 160's. Also with BLE edema which is reportedly new.  ED Course:Confusion turned into agitated delirium. Found to be in A fib with RVR, started on cardizem gtt. Chest x-ray showing likely pneumonia. WBC normal, no fever. BNP 306.6, troponin negative x 2, lactic acid normal. Patient was admitted to [facility] for acute hypoxic respiratory failure, acute metabolic encephalopathy, and A-fib with RVR.  Assessment: Acute hypoxic respiratory failure Postobstructive pneumonia in the setting of possible lung neoplasm Atrial fibrillation with RVR Possible diastolic heart failure Alzheimer's dementia Physical deconditioning Terminal care  Recommendations/Plan:  Continue full comfort measures - prognosis likely days  Continue DNR/DNI as previously documented  Transfer to [place] in [facility] when bed becomes available  Continue current comfort focused medication regimen - patient appears comfortable  Nursing to provide frequent assessments and administer PRN medications as clinically necessary to ensure EOL comfort  PMT will continue to follow and support holistically  Goals of Care and Additional Recommendations:  Limitations on Scope of Treatment: Full Comfort Care  Code Status:    Code Status Orders  (From admission, onward)         Start     Ordered   12/10/20 1929  Do not attempt resuscitation (DNR)  Continuous       Question Answer Comment  In the event of cardiac or respiratory ARREST Do not call a "code blue"   In the event of cardiac or respiratory ARREST Do not perform Intubation, CPR, defibrillation or ACLS   In the event of cardiac or respiratory ARREST Use medication by any route,  position, wound care, and other measures to relive pain and suffering. May use oxygen, suction and manual treatment of airway obstruction as needed for comfort.   Comments Confirmed with  family      12/10/20 1931        Code Status History    Date Active Date Inactive Code Status Order ID Comments User Context   12/08/2020 2126 12/10/2020 1931 DNR [id number]  Etta Quill, DO ED   Advance Care Planning Activity       Prognosis:   < 2 weeks  Discharge Planning:  Hospice facility  Care plan was discussed with primary RN, patient's daughter  Thank you for allowing the Palliative Medicine Team to assist in the care of this patient.   Total Time 27 minutes Prolonged Time Billed  no       Greater than 50%  of this time was spent counseling and coordinating care related to the above assessment and plan.  Lin Landsman, NP  Please contact Palliative Medicine Team phone at [phone] for questions and concerns.

## 2020-12-14 NOTE — Progress Notes (Signed)
PROGRESS NOTE  Brandy Wright GLO:[id number] DOB: 1933/09/05   PCP: Patient, No Pcp Per  Patient is from: SNF  DOA: 12/08/2020 LOS: 6  Chief complaints: Shortness of breath and altered mental status  Brief Narrative / Interim history: 87 year old AAF with PMH of Alzheimer's dementia, asthma, frequent falls and recent pneumonia brought to ED from SNF with AMS, hypoxia to 80% and tachycardia to 160s.  Upon arrival to ED, patient was confused, agitated and hypoxic to 80s.  She was also in A. fib with RVR and placed on Cardizem drip.  Started on broad-spectrum antibiotics for postobstructive pneumonia due to possible malignancy. Palliative medicine consulted and patient was transitioned to full comfort care after meeting with patient and patient's family.  Patient to be discharged to residential hospice in [facility] when bed available if stable for transfer.  Subjective: Seen and examined.  Sleepy, arousable to touch.  Trying to talk but mumbles.  Looks comfortable.  Objective: Vitals:   12/12/20 2106 12/13/20 0812 12/13/20 1425 12/14/20 1016  BP: 125/67  (!) 111/54 118/66  Pulse: 79 91 79   Resp: 18 17 14 16   Temp: 98.1 F (36.7 C)  97.7 F (36.5 C) 97.7 F (36.5 C)  TempSrc: Oral   Axillary  SpO2: 97% 95% 100% (!) 84%  Weight:      Height:        Intake/Output Summary (Last 24 hours) at 12/14/2020 1447 Last data filed at 12/14/2020 0500 Gross per 24 hour  Intake --  Output 350 ml  Net -350 ml   Filed Weights   12/09/20 1507  Weight: 64.6 kg    Examination: General exam: Appears calm and comfortable  Respiratory system: Clear to auscultation. Respiratory effort normal. Cardiovascular system: S1 & S2 heard, RRR. No JVD, murmurs, rubs, gallops or clicks. No pedal edema. Gastrointestinal system: Abdomen is nondistended, soft and nontender. No organomegaly or masses felt. Normal bowel sounds heard.  Procedures:  None  Microbiology summarized: COVID-19 PCR  nonreactive. MRSA PCR screen negative Blood cultures NGTD Urine culture without significant growth.  Assessment & Plan: End-of-life care/comfort measures only/DNR/DNI -Appreciate help by palliative medicine team -Plan for transfer to residential hospice when bed available if stable for transfer. Patient comfortable at this point in time.  Acute hypoxemic respiratory failure due to postobstructive pneumonia in the setting of possible lung neoplasm: No respiratory distress on 1 L. A. fib with RVR:  Possible diastolic heart failure Alzheimer's dementia/delirium: Pyuria/bacteriuria:  Hypokalemia/hypomagnesemia: Resolved Normocytic anemia: Stable Hyperlipidemia:  Debility/physical deconditioning Goal of care/DNR/DNI-appropriate.  Body mass index is 23.7 kg/m.         DVT prophylaxis:  None.  End-of-life care.  Code Status: DNR/DNI Family Communication: No family at bedside. Level of care: Med-Surg Status is: Inpatient  Remains inpatient appropriate because:Unsafe d/c plan   Dispo: The patient is from: SNF              Anticipated d/c is to: Residential hospice              Anticipated d/c date is: 1 day              Patient currently is medically stable to d/c.   Difficult to place patient No       Consultants:  Palliative medicine   Sch Meds:  Scheduled Meds: . morphine CONCENTRATE  5 mg Sublingual Q4H   Continuous Infusions:  PRN Meds:.acetaminophen, albuterol, antiseptic oral rinse, glycopyrrolate **OR** glycopyrrolate, haloperidol lactate, LORazepam, morphine injection, ondansetron **OR**  ondansetron (ZOFRAN) IV, polyvinyl alcohol  Antimicrobials: Anti-infectives (From admission, onward)   Start     Dose/Rate Route Frequency Ordered Stop   12/10/20 1600  vancomycin (VANCOREADY) IVPB 1750 mg/350 mL  Status:  Discontinued        1,750 mg 175 mL/hr over 120 Minutes Intravenous Every 48 hours 12/08/20 1637 12/09/20 1023   12/09/20 1700  vancomycin  (VANCOREADY) IVPB 1000 mg/200 mL  Status:  Discontinued        1,000 mg 200 mL/hr over 60 Minutes Intravenous Every 24 hours 12/09/20 1023 12/10/20 0751   12/09/20 0500  cefTRIAXone (ROCEPHIN) 2 g in sodium chloride 0.9 % 100 mL IVPB  Status:  Discontinued        2 g 200 mL/hr over 30 Minutes Intravenous Every 24 hours 12/08/20 2126 12/10/20 1928   12/08/20 2130  doxycycline (VIBRAMYCIN) 100 mg in sodium chloride 0.9 % 250 mL IVPB  Status:  Discontinued        100 mg 125 mL/hr over 120 Minutes Intravenous Every 12 hours 12/08/20 2126 12/10/20 1928   12/08/20 1600  vancomycin (VANCOREADY) IVPB 1500 mg/300 mL        1,500 mg 150 mL/hr over 120 Minutes Intravenous  Once 12/08/20 1554 12/08/20 1906   12/08/20 1545  ceFEPIme (MAXIPIME) 2 g in sodium chloride 0.9 % 100 mL IVPB        2 g 200 mL/hr over 30 Minutes Intravenous  Once 12/08/20 1534 12/08/20 1703       I have personally reviewed the following labs and images: CBC: Recent Labs  Lab 12/08/20 1503 12/09/20 0328  WBC 9.2 6.5  HGB 10.8* 10.2*  HCT 35.4* 32.7*  MCV 83.3 83.6  PLT 435* 361   BMP &GFR Recent Labs  Lab 12/08/20 1523 12/09/20 0328 12/09/20 1526 12/10/20 0259  NA 137 135  --  139  K 3.9 3.4*  --  3.6  CL 100 99  --  104  CO2 26 23  --  24  GLUCOSE 103* 90  --  75  BUN 13 11  --  9  CREATININE 0.63 0.63  --  0.73  CALCIUM 8.9 8.1*  --  8.6*  MG  --   --  1.6* 2.2   Estimated Creatinine Clearance: 46.3 mL/min (by C-G formula based on SCr of 0.73 mg/dL). Liver & Pancreas: Recent Labs  Lab 12/08/20 1523  AST 21  ALT 17  ALKPHOS 95  BILITOT 0.5  PROT 6.7  ALBUMIN 2.7*   No results for input(s): LIPASE, AMYLASE in the last 168 hours. No results for input(s): AMMONIA in the last 168 hours. Diabetic: No results for input(s): HGBA1C in the last 72 hours. No results for input(s): GLUCAP in the last 168 hours. Cardiac Enzymes: No results for input(s): CKTOTAL, CKMB, CKMBINDEX, TROPONINI in the last  168 hours. No results for input(s): PROBNP in the last 8760 hours. Coagulation Profile: Recent Labs  Lab 12/08/20 1523  INR 1.2   Thyroid Function Tests: No results for input(s): TSH, T4TOTAL, FREET4, T3FREE, THYROIDAB in the last 72 hours. Lipid Profile: No results for input(s): CHOL, HDL, LDLCALC, TRIG, CHOLHDL, LDLDIRECT in the last 72 hours. Anemia Panel: No results for input(s): VITAMINB12, FOLATE, FERRITIN, TIBC, IRON, RETICCTPCT in the last 72 hours. Urine analysis:    Component Value Date/Time   COLORURINE YELLOW 12/08/2020 1918   APPEARANCEUR CLEAR 12/08/2020 1918   LABSPEC 1.009 12/08/2020 1918   PHURINE 5.0 12/08/2020 1918   GLUCOSEU  NEGATIVE 12/08/2020 1918   HGBUR NEGATIVE 12/08/2020 1918   BILIRUBINUR NEGATIVE 12/08/2020 1918   [place] NEGATIVE 12/08/2020 1918   PROTEINUR NEGATIVE 12/08/2020 1918   NITRITE NEGATIVE 12/08/2020 1918   LEUKOCYTESUR LARGE (A) 12/08/2020 1918   Sepsis Labs: Invalid input(s): PROCALCITONIN, [place]  Microbiology: Recent Results (from the past 240 hour(s))  Resp Panel by RT-PCR (Flu A&B, Covid) Nasopharyngeal Swab     Status: None   Collection Time: 12/08/20  3:28 PM   Specimen: Nasopharyngeal Swab; Nasopharyngeal(NP) swabs in vial transport medium  Result Value Ref Range Status   SARS Coronavirus 2 by RT PCR NEGATIVE NEGATIVE Final    Comment: (NOTE) SARS-CoV-2 target nucleic acids are NOT DETECTED.  The SARS-CoV-2 RNA is generally detectable in upper respiratory specimens during the acute phase of infection. The lowest concentration of SARS-CoV-2 viral copies this assay can detect is 138 copies/mL. A negative result does not preclude SARS-Cov-2 infection and should not be used as the sole basis for treatment or other patient management decisions. A negative result may occur with  improper specimen collection/handling, submission of specimen other than nasopharyngeal swab, presence of viral mutation(s) within the areas  targeted by this assay, and inadequate number of viral copies(<138 copies/mL). A negative result must be combined with clinical observations, patient history, and epidemiological information. The expected result is Negative.  Fact Sheet for Patients:  EntrepreneurPulse.com.au  Fact Sheet for Healthcare Providers:  IncredibleEmployment.be  This test is no t yet approved or cleared by the [place] FDA and  has been authorized for detection and/or diagnosis of SARS-CoV-2 by FDA under an Emergency Use Authorization (EUA). This EUA will remain  in effect (meaning this test can be used) for the duration of the COVID-19 declaration under Section 564(b)(1) of the Act, 21 U.S.C.section 360bbb-3(b)(1), unless the authorization is terminated  or revoked sooner.       Influenza A by PCR NEGATIVE NEGATIVE Final   Influenza B by PCR NEGATIVE NEGATIVE Final    Comment: (NOTE) The Xpert Xpress SARS-CoV-2/FLU/RSV plus assay is intended as an aid in the diagnosis of influenza from Nasopharyngeal swab specimens and should not be used as a sole basis for treatment. Nasal washings and aspirates are unacceptable for Xpert Xpress SARS-CoV-2/FLU/RSV testing.  Fact Sheet for Patients: EntrepreneurPulse.com.au  Fact Sheet for Healthcare Providers: IncredibleEmployment.be  This test is not yet approved or cleared by the [place] FDA and has been authorized for detection and/or diagnosis of SARS-CoV-2 by FDA under an Emergency Use Authorization (EUA). This EUA will remain in effect (meaning this test can be used) for the duration of the COVID-19 declaration under Section 564(b)(1) of the Act, 21 U.S.C. section 360bbb-3(b)(1), unless the authorization is terminated or revoked.  Performed at [facility], [place] [address] [place], [place] [address]   Culture, blood (routine x 2)     Status: None   Collection Time:  12/08/20  3:47 PM   Specimen: BLOOD  Result Value Ref Range Status   Specimen Description BLOOD LEFT ANTECUBITAL  Final   Special Requests   Final    BOTTLES DRAWN AEROBIC ONLY Blood Culture results may not be optimal due to an inadequate volume of blood received in culture bottles   Culture   Final    NO GROWTH 5 DAYS Performed at [facility], [place] [address], [place], [place] [address]    Report Status 12/13/2020 FINAL  Final  Culture, blood (routine x 2)  Status: None   Collection Time: 12/08/20  4:23 PM   Specimen: BLOOD  Result Value Ref Range Status   Specimen Description BLOOD SITE NOT SPECIFIED  Final   Special Requests   Final    BOTTLES DRAWN AEROBIC ONLY Blood Culture results may not be optimal due to an inadequate volume of blood received in culture bottles   Culture   Final    NO GROWTH 5 DAYS Performed at [facility], [address], [place], [place] [address]    Report Status 12/13/2020 FINAL  Final  Culture, Urine     Status: Abnormal   Collection Time: 12/08/20  8:10 PM   Specimen: Urine, Random  Result Value Ref Range Status   Specimen Description URINE, RANDOM  Final   Special Requests NONE  Final   Culture (A)  Final    <10,000 COLONIES/mL INSIGNIFICANT GROWTH Performed at [facility], [place] [address], [place], [place] [address]    Report Status 12/10/2020 FINAL  Final  MRSA PCR Screening     Status: None   Collection Time: 12/09/20 10:03 AM   Specimen: Nasopharyngeal Swab  Result Value Ref Range Status   MRSA by PCR NEGATIVE NEGATIVE Final    Comment:        The GeneXpert MRSA Assay (FDA approved for NASAL specimens only), is one component of a comprehensive MRSA colonization surveillance program. It is not intended to diagnose MRSA infection nor to guide or monitor treatment for MRSA infections. Performed at [facility], [place] [address] [place], [place] [address]   Respiratory (~20 pathogens) panel by PCR      Status: None   Collection Time: 12/09/20  2:42 PM   Specimen: Nasopharyngeal Swab; Respiratory  Result Value Ref Range Status   Adenovirus NOT DETECTED NOT DETECTED Final   Coronavirus 229E NOT DETECTED NOT DETECTED Final    Comment: (NOTE) The Coronavirus on the Respiratory Panel, DOES NOT test for the novel  Coronavirus (2019 nCoV)    Coronavirus HKU1 NOT DETECTED NOT DETECTED Final   Coronavirus NL63 NOT DETECTED NOT DETECTED Final   Coronavirus OC43 NOT DETECTED NOT DETECTED Final   Metapneumovirus NOT DETECTED NOT DETECTED Final   Rhinovirus / Enterovirus NOT DETECTED NOT DETECTED Final   Influenza A NOT DETECTED NOT DETECTED Final   Influenza B NOT DETECTED NOT DETECTED Final   Parainfluenza Virus 1 NOT DETECTED NOT DETECTED Final   Parainfluenza Virus 2 NOT DETECTED NOT DETECTED Final   Parainfluenza Virus 3 NOT DETECTED NOT DETECTED Final   Parainfluenza Virus 4 NOT DETECTED NOT DETECTED Final   Respiratory Syncytial Virus NOT DETECTED NOT DETECTED Final   Bordetella pertussis NOT DETECTED NOT DETECTED Final   Bordetella Parapertussis NOT DETECTED NOT DETECTED Final   Chlamydophila pneumoniae NOT DETECTED NOT DETECTED Final   Mycoplasma pneumoniae NOT DETECTED NOT DETECTED Final    Comment: Performed at [facility], [place]. [address], [place], [place] [address]    Radiology Studies: No results found.  Darliss Cheney, MD [facility]  If 7PM-7AM, please contact night-coverage [email or web address] 12/14/2020, 2:47 PM

## 2020-12-15 NOTE — Progress Notes (Signed)
PROGRESS NOTE  Brandy Wright IHK:[id number] DOB: April 19, 1933   PCP: Patient, No Pcp Per  Patient is from: SNF  DOA: 12/08/2020 LOS: 7  Chief complaints: Shortness of breath and altered mental status  Brief Narrative / Interim history: 87 year old AAF with PMH of Alzheimer's dementia, asthma, frequent falls and recent pneumonia brought to ED from SNF with AMS, hypoxia to 80% and tachycardia to 160s.  Upon arrival to ED, patient was confused, agitated and hypoxic to 80s.  She was also in A. fib with RVR and placed on Cardizem drip.  Started on broad-spectrum antibiotics for postobstructive pneumonia due to possible malignancy. Palliative medicine consulted and patient was transitioned to full comfort care after meeting with patient and patient's family.  Patient to be discharged to residential hospice in [facility] when bed available if stable for transfer.  Subjective: Seen and examined early morning.  No family around.  Patient was in deep sleep but comfortable.  Objective: Vitals:   12/13/20 1425 12/14/20 1016 12/14/20 2008 12/15/20 0812  BP: (!) 111/54 118/66 119/70 113/64  Pulse: 79  (!) 108 (!) 104  Resp: 14 16 20 20   Temp: 97.7 F (36.5 C) 97.7 F (36.5 C) 98.8 F (37.1 C)   TempSrc:  Axillary Axillary   SpO2: 100% (!) 84% (!) 83% (!) 62%  Weight:      Height:        Intake/Output Summary (Last 24 hours) at 12/15/2020 1447 Last data filed at 12/15/2020 0301 Gross per 24 hour  Intake --  Output 120 ml  Net -120 ml   Filed Weights   12/09/20 1507  Weight: 64.6 kg    Examination: General exam: Appears calm and comfortable  Respiratory system: Clear to auscultation. Respiratory effort normal. Cardiovascular system: S1 & S2 heard, RRR. No JVD, murmurs, rubs, gallops or clicks. No pedal edema. Gastrointestinal system: Abdomen is nondistended, soft and nontender. No organomegaly or masses felt. Normal bowel sounds heard.  Procedures:  None  Microbiology  summarized: COVID-19 PCR nonreactive. MRSA PCR screen negative Blood cultures NGTD Urine culture without significant growth.  Assessment & Plan: End-of-life care/comfort measures only/DNR/DNI -Appreciate help by palliative medicine team -Plan for transfer to residential hospice when bed available if stable for transfer. Patient comfortable at this point in time.  Acute hypoxemic respiratory failure due to postobstructive pneumonia in the setting of possible lung neoplasm: No respiratory distress on 1 L. A. fib with RVR:  Possible diastolic heart failure Alzheimer's dementia/delirium: Pyuria/bacteriuria:  Hypokalemia/hypomagnesemia: Resolved Normocytic anemia: Stable Hyperlipidemia:  Debility/physical deconditioning Goal of care/DNR/DNI-appropriate.  Body mass index is 23.7 kg/m.         DVT prophylaxis:  None.  End-of-life care.  Code Status: DNR/DNI Family Communication: No family at bedside. Level of care: Med-Surg Status is: Inpatient  Remains inpatient appropriate because:Unsafe d/c plan   Dispo: The patient is from: SNF              Anticipated d/c is to: Residential hospice              Anticipated d/c date is: 1 day              Patient currently is medically stable to d/c.   Difficult to place patient No       Consultants:  Palliative medicine   Sch Meds:  Scheduled Meds: . morphine CONCENTRATE  5 mg Sublingual Q4H   Continuous Infusions:  PRN Meds:.acetaminophen, albuterol, antiseptic oral rinse, glycopyrrolate **OR** glycopyrrolate, haloperidol lactate, LORazepam,  morphine injection, ondansetron **OR** ondansetron (ZOFRAN) IV, polyvinyl alcohol  Antimicrobials: Anti-infectives (From admission, onward)   Start     Dose/Rate Route Frequency Ordered Stop   12/10/20 1600  vancomycin (VANCOREADY) IVPB 1750 mg/350 mL  Status:  Discontinued        1,750 mg 175 mL/hr over 120 Minutes Intravenous Every 48 hours 12/08/20 1637 12/09/20 1023    12/09/20 1700  vancomycin (VANCOREADY) IVPB 1000 mg/200 mL  Status:  Discontinued        1,000 mg 200 mL/hr over 60 Minutes Intravenous Every 24 hours 12/09/20 1023 12/10/20 0751   12/09/20 0500  cefTRIAXone (ROCEPHIN) 2 g in sodium chloride 0.9 % 100 mL IVPB  Status:  Discontinued        2 g 200 mL/hr over 30 Minutes Intravenous Every 24 hours 12/08/20 2126 12/10/20 1928   12/08/20 2130  doxycycline (VIBRAMYCIN) 100 mg in sodium chloride 0.9 % 250 mL IVPB  Status:  Discontinued        100 mg 125 mL/hr over 120 Minutes Intravenous Every 12 hours 12/08/20 2126 12/10/20 1928   12/08/20 1600  vancomycin (VANCOREADY) IVPB 1500 mg/300 mL        1,500 mg 150 mL/hr over 120 Minutes Intravenous  Once 12/08/20 1554 12/08/20 1906   12/08/20 1545  ceFEPIme (MAXIPIME) 2 g in sodium chloride 0.9 % 100 mL IVPB        2 g 200 mL/hr over 30 Minutes Intravenous  Once 12/08/20 1534 12/08/20 1703       I have personally reviewed the following labs and images: CBC: Recent Labs  Lab 12/08/20 1503 12/09/20 0328  WBC 9.2 6.5  HGB 10.8* 10.2*  HCT 35.4* 32.7*  MCV 83.3 83.6  PLT 435* 361   BMP &GFR Recent Labs  Lab 12/08/20 1523 12/09/20 0328 12/09/20 1526 12/10/20 0259  NA 137 135  --  139  K 3.9 3.4*  --  3.6  CL 100 99  --  104  CO2 26 23  --  24  GLUCOSE 103* 90  --  75  BUN 13 11  --  9  CREATININE 0.63 0.63  --  0.73  CALCIUM 8.9 8.1*  --  8.6*  MG  --   --  1.6* 2.2   Estimated Creatinine Clearance: 46.3 mL/min (by C-G formula based on SCr of 0.73 mg/dL). Liver & Pancreas: Recent Labs  Lab 12/08/20 1523  AST 21  ALT 17  ALKPHOS 95  BILITOT 0.5  PROT 6.7  ALBUMIN 2.7*   No results for input(s): LIPASE, AMYLASE in the last 168 hours. No results for input(s): AMMONIA in the last 168 hours. Diabetic: No results for input(s): HGBA1C in the last 72 hours. No results for input(s): GLUCAP in the last 168 hours. Cardiac Enzymes: No results for input(s): CKTOTAL, CKMB,  CKMBINDEX, TROPONINI in the last 168 hours. No results for input(s): PROBNP in the last 8760 hours. Coagulation Profile: Recent Labs  Lab 12/08/20 1523  INR 1.2   Thyroid Function Tests: No results for input(s): TSH, T4TOTAL, FREET4, T3FREE, THYROIDAB in the last 72 hours. Lipid Profile: No results for input(s): CHOL, HDL, LDLCALC, TRIG, CHOLHDL, LDLDIRECT in the last 72 hours. Anemia Panel: No results for input(s): VITAMINB12, FOLATE, FERRITIN, TIBC, IRON, RETICCTPCT in the last 72 hours. Urine analysis:    Component Value Date/Time   COLORURINE YELLOW 12/08/2020 1918   APPEARANCEUR CLEAR 12/08/2020 1918   LABSPEC 1.009 12/08/2020 1918   PHURINE 5.0 12/08/2020  1918   GLUCOSEU NEGATIVE 12/08/2020 1918   HGBUR NEGATIVE 12/08/2020 1918   BILIRUBINUR NEGATIVE 12/08/2020 1918   KETONESUR NEGATIVE 12/08/2020 1918   PROTEINUR NEGATIVE 12/08/2020 1918   NITRITE NEGATIVE 12/08/2020 1918   [place] (A) 12/08/2020 1918   Sepsis Labs: Invalid input(s): PROCALCITONIN, [place]  Microbiology: Recent Results (from the past 240 hour(s))  Resp Panel by RT-PCR (Flu A&B, Covid) Nasopharyngeal Swab     Status: None   Collection Time: 12/08/20  3:28 PM   Specimen: Nasopharyngeal Swab; Nasopharyngeal(NP) swabs in vial transport medium  Result Value Ref Range Status   SARS Coronavirus 2 by RT PCR NEGATIVE NEGATIVE Final    Comment: (NOTE) SARS-CoV-2 target nucleic acids are NOT DETECTED.  The SARS-CoV-2 RNA is generally detectable in upper respiratory specimens during the acute phase of infection. The lowest concentration of SARS-CoV-2 viral copies this assay can detect is 138 copies/mL. A negative result does not preclude SARS-Cov-2 infection and should not be used as the sole basis for treatment or other patient management decisions. A negative result may occur with  improper specimen collection/handling, submission of specimen other than nasopharyngeal swab, presence of  viral mutation(s) within the areas targeted by this assay, and inadequate number of viral copies(<138 copies/mL). A negative result must be combined with clinical observations, patient history, and epidemiological information. The expected result is Negative.  Fact Sheet for Patients:  EntrepreneurPulse.com.au  Fact Sheet for Healthcare Providers:  IncredibleEmployment.be  This test is no t yet approved or cleared by the [place] FDA and  has been authorized for detection and/or diagnosis of SARS-CoV-2 by FDA under an Emergency Use Authorization (EUA). This EUA will remain  in effect (meaning this test can be used) for the duration of the COVID-19 declaration under Section 564(b)(1) of the Act, 21 U.S.C.section 360bbb-3(b)(1), unless the authorization is terminated  or revoked sooner.       Influenza A by PCR NEGATIVE NEGATIVE Final   Influenza B by PCR NEGATIVE NEGATIVE Final    Comment: (NOTE) The Xpert Xpress SARS-CoV-2/FLU/RSV plus assay is intended as an aid in the diagnosis of influenza from Nasopharyngeal swab specimens and should not be used as a sole basis for treatment. Nasal washings and aspirates are unacceptable for Xpert Xpress SARS-CoV-2/FLU/RSV testing.  Fact Sheet for Patients: EntrepreneurPulse.com.au  Fact Sheet for Healthcare Providers: IncredibleEmployment.be  This test is not yet approved or cleared by the [place] FDA and has been authorized for detection and/or diagnosis of SARS-CoV-2 by FDA under an Emergency Use Authorization (EUA). This EUA will remain in effect (meaning this test can be used) for the duration of the COVID-19 declaration under Section 564(b)(1) of the Act, 21 U.S.C. section 360bbb-3(b)(1), unless the authorization is terminated or revoked.  Performed at [facility], [place] [address], [place], [place] [address]   Culture, blood (routine x 2)      Status: None   Collection Time: 12/08/20  3:47 PM   Specimen: BLOOD  Result Value Ref Range Status   Specimen Description BLOOD LEFT ANTECUBITAL  Final   Special Requests   Final    BOTTLES DRAWN AEROBIC ONLY Blood Culture results may not be optimal due to an inadequate volume of blood received in culture bottles   Culture   Final    NO GROWTH 5 DAYS Performed at [facility], [place] [address] [place], [place] [address]    Report Status 12/13/2020 FINAL  Final  Culture, blood (routine x 2)  Status: None   Collection Time: 12/08/20  4:23 PM   Specimen: BLOOD  Result Value Ref Range Status   Specimen Description BLOOD SITE NOT SPECIFIED  Final   Special Requests   Final    BOTTLES DRAWN AEROBIC ONLY Blood Culture results may not be optimal due to an inadequate volume of blood received in culture bottles   Culture   Final    NO GROWTH 5 DAYS Performed at [facility], [address], [place], [place] [address]    Report Status 12/13/2020 FINAL  Final  Culture, Urine     Status: Abnormal   Collection Time: 12/08/20  8:10 PM   Specimen: Urine, Random  Result Value Ref Range Status   Specimen Description URINE, RANDOM  Final   Special Requests NONE  Final   Culture (A)  Final    <10,000 COLONIES/mL INSIGNIFICANT GROWTH Performed at [facility], [place] [address], [place], [place] [address]    Report Status 12/10/2020 FINAL  Final  MRSA PCR Screening     Status: None   Collection Time: 12/09/20 10:03 AM   Specimen: Nasopharyngeal Swab  Result Value Ref Range Status   MRSA by PCR NEGATIVE NEGATIVE Final    Comment:        The GeneXpert MRSA Assay (FDA approved for NASAL specimens only), is one component of a comprehensive MRSA colonization surveillance program. It is not intended to diagnose MRSA infection nor to guide or monitor treatment for MRSA infections. Performed at [facility], [place] [address], [place], [place] [address]   Respiratory (~20  pathogens) panel by PCR     Status: None   Collection Time: 12/09/20  2:42 PM   Specimen: Nasopharyngeal Swab; Respiratory  Result Value Ref Range Status   Adenovirus NOT DETECTED NOT DETECTED Final   Coronavirus 229E NOT DETECTED NOT DETECTED Final    Comment: (NOTE) The Coronavirus on the Respiratory Panel, DOES NOT test for the novel  Coronavirus (2019 nCoV)    Coronavirus HKU1 NOT DETECTED NOT DETECTED Final   Coronavirus NL63 NOT DETECTED NOT DETECTED Final   Coronavirus OC43 NOT DETECTED NOT DETECTED Final   Metapneumovirus NOT DETECTED NOT DETECTED Final   Rhinovirus / Enterovirus NOT DETECTED NOT DETECTED Final   Influenza A NOT DETECTED NOT DETECTED Final   Influenza B NOT DETECTED NOT DETECTED Final   Parainfluenza Virus 1 NOT DETECTED NOT DETECTED Final   Parainfluenza Virus 2 NOT DETECTED NOT DETECTED Final   Parainfluenza Virus 3 NOT DETECTED NOT DETECTED Final   Parainfluenza Virus 4 NOT DETECTED NOT DETECTED Final   Respiratory Syncytial Virus NOT DETECTED NOT DETECTED Final   Bordetella pertussis NOT DETECTED NOT DETECTED Final   Bordetella Parapertussis NOT DETECTED NOT DETECTED Final   Chlamydophila pneumoniae NOT DETECTED NOT DETECTED Final   Mycoplasma pneumoniae NOT DETECTED NOT DETECTED Final    Comment: Performed at [facility], [place] [place], [place] [address]    Radiology Studies: No results found.  Darliss Cheney, MD [facility]  If 7PM-7AM, please contact night-coverage [email or web address] 12/15/2020, 2:47 PM

## 2020-12-15 NOTE — Progress Notes (Signed)
Daily Progress Note   Patient Name: Brandy Wright       Date: 12/15/2020 DOB: 1933-06-21  Age: 87 y.o. MRN#: [id number] Attending Physician: Darliss Cheney, MD Primary Care Physician: Patient, No Pcp Per Admit Date: 12/08/2020  Reason for Follow-up: end of life care, symptom management  Subjective: Patient appears comfortable. Unresponsive to voice and light touch. Respirations are even and unlabored. Daughter [name] is at bedside. She reports patient has been minimally responsive for the last 3 days. Patient does open her eyes spontaneously during my visit, but does not track. Provided education and counseling on expectations at EOL, including decreased responsiveness   Length of Stay: 7  Current Medications: Scheduled Meds:  . morphine CONCENTRATE  5 mg Sublingual Q4H      PRN Meds: acetaminophen, albuterol, antiseptic oral rinse, glycopyrrolate **OR** glycopyrrolate, haloperidol lactate, LORazepam, morphine injection, ondansetron **OR** ondansetron (ZOFRAN) IV, polyvinyl alcohol         Vital Signs: BP 113/64 (BP Location: Left Arm)   Pulse (!) 104   Temp 98.8 F (37.1 C) (Axillary)   Resp 20   Ht 5\' 5"  (1.651 m)   Wt 64.6 kg   SpO2 (!) 62%   BMI 23.70 kg/m  SpO2: SpO2: (!) 62 % O2 Device: O2 Device: Room Air O2 Flow Rate: O2 Flow Rate (L/min): 2 L/min  Intake/output summary:   Intake/Output Summary (Last 24 hours) at 12/15/2020 1630 Last data filed at 12/15/2020 0301 Gross per 24 hour  Intake --  Output 120 ml  Net -120 ml   LBM: Last BM Date: 12/12/20 Baseline Weight: Weight: 64.6 kg Most recent weight: Weight: 64.6 kg       Palliative Assessment/Data: PPS 10%         Palliative Care Assessment & Plan   HPI/Patient Profile: 87 y.o. female  with past  medical history of asthma, dementia, GERD, and frequent falls presented to the emergency department on 12/08/2020 with confusion and shortness of breath. She reportedly had been diagnosed with pneumonia at her SNF on 2/18. On EMS arrival to SNF, o2 saturation was around 80% and she was tachycardic in the 160's. Also with BLE edema which is reportedly new.  ED Course: Confusion turned into agitated delirium. Found to be in A fib with RVR, started on cardizem gtt. Chest x-ray showing likely  pneumonia. WBC normal, no fever. BNP 306.6, troponin negative x 2, lactic acid normal. Patient was admitted to [facility] for acute hypoxic respiratory failure, acute metabolic encephalopathy, and A-fib with RVR.   Assessment: - acute hypoxic respiratory failure due to postobstructive pneumonia in the setting of possible lung malignancy - A-fib with RVR - possible diastolic heart failure - Alzheimer's dementia - failure to thrive - end of life care   Recommendations/Plan:  Continue full comfort measures   DNR/DNI as previously documented  Transfer to [facility] when bed becomes available  Continue scheduled morphine concentrate solution 5 mg every 4 hours  Continue unrestricted visitation    Appreciate Spiritual Care support  Provide frequent assessments and administer PRN medications as clinically necessary to ensure EOL comfort  PMT will continue to follow holistically   Goals of Care and Additional Recommendations:  Limitations on Scope of Treatment: Full Comfort Care  Code Status:  Prognosis:   < 2 weeks  Discharge Planning:  Hospice facility   Thank you for allowing the Palliative Medicine Team to assist in the care of this patient.   Total Time 15 minutes Prolonged Time Billed  no       Greater than 50%  of this time was spent counseling and coordinating care related to the above assessment and plan.  Lavena Bullion, NP  Please contact Palliative Medicine Team  phone at [phone] for questions and concerns.

## 2020-12-15 NOTE — Progress Notes (Signed)
This chaplain is present with the Pt. for F/U spiritual care.  The Pt. daughter-Brandy Wright is at the Pt. bedside.    Upon arrival the chaplain accepted Brandy Wright's request to read one of her journal entries to the Pt.  Reflective listening and affirmation was offered to Brandy Wright after the reading.   Afterward, the chaplain listened as Brandy Wright updated the chaplain on the weekend's care.  The chaplain understands EOL communication is occurring with the Pt. family. Brandy Wright remains open to [place] for continued time with the Pt.  Prayer was shared with the family. This chaplain is available for F/U spiritual care as needed.

## 2020-12-15 NOTE — Plan of Care (Signed)

## 2020-12-16 DIAGNOSIS — R451 Restlessness and agitation: Secondary | ICD-10-CM

## 2020-12-16 NOTE — TOC Transition Note (Signed)
Transition of Care [facility]) - CM/SW Discharge Note   Patient Details  Name: Brandy Wright MRN: [id number] Date of Birth: 09-04-1933  [facility]) CM/SW Contact:  Joanne Chars, LCSW Phone Number: 12/16/2020, 2:46 PM   Clinical Narrative:   Pt discharging to [facility].  RN call report to [phone].      Final next level of care: [place] Barriers to Discharge: No Barriers Identified   Patient Goals and CMS Choice   CMS Medicare.gov Compare Post Acute Care list provided to:: Patient Represenative (must comment) Choice offered to / list presented to : Adult Children  Discharge Placement              Patient chooses bed at:  [facility]) Patient to be transferred to facility by: [place] Name of family member notified: Janine Patient and family notified of of transfer: 12/16/20  Discharge Plan and Services In-house Referral: Clinical Social Work   Post Acute Care Choice: Hospice                               Social Determinants of Health (SDOH) Interventions     Readmission Risk Interventions No flowsheet data found.

## 2020-12-16 NOTE — TOC Progression Note (Addendum)
Transition of Care [facility]) - Progression Note    Patient Details  Name: Brandy Wright MRN: [id number] Date of Birth: [birth year]  Transition of Care [facility]) CM/SW Contact  Joanne Chars, LCSW Phone Number: 12/16/2020, 10:24 AM  Clinical Narrative:   Update from [place] [place].  No bed available today. CSW spoke with [name], pt daughter about any interest in looking at [facility] but daughter wants to wait for bed at [facility] facility.     Expected Discharge Plan: [place] Barriers to Discharge: No Barriers Identified  Expected Discharge Plan and Services Expected Discharge Plan: [place] In-house Referral: Clinical Social Work   Post Acute Care Choice: Hospice Living arrangements for the past 2 months: [place]                                       Social Determinants of Health (SDOH) Interventions    Readmission Risk Interventions No flowsheet data found.

## 2020-12-16 NOTE — Progress Notes (Signed)
PROGRESS NOTE  Brandy Wright TJQ:[id number] DOB: January 27, 1933   PCP: Patient, No Pcp Per  Patient is from: SNF  DOA: 12/08/2020 LOS: 8  Chief complaints: Shortness of breath and altered mental status  Brief Narrative / Interim history: 87 year old AAF with PMH of Alzheimer's dementia, asthma, frequent falls and recent pneumonia brought to ED from SNF with AMS, hypoxia to 80% and tachycardia to 160s.  Upon arrival to ED, patient was confused, agitated and hypoxic to 80s.  She was also in A. fib with RVR and placed on Cardizem drip.  Started on broad-spectrum antibiotics for postobstructive pneumonia due to possible malignancy. Palliative medicine consulted and patient was transitioned to full comfort care after meeting with patient and patient's family.  Patient to be discharged to residential hospice in [facility] when bed available if stable for transfer.  Subjective: Seen and examined.  Patient in deep sleep.  Now having some apneic spells.  Looks comfortable.  No family around.  Objective: Vitals:   12/13/20 1425 12/14/20 1016 12/14/20 2008 12/15/20 0812  BP: (!) 111/54 118/66 119/70 113/64  Pulse: 79  (!) 108 (!) 104  Resp: 14 16 20 20   Temp: 97.7 F (36.5 C) 97.7 F (36.5 C) 98.8 F (37.1 C)   TempSrc:  Axillary Axillary   SpO2: 100% (!) 84% (!) 83% (!) 62%  Weight:      Height:       No intake or output data in the 24 hours ending 12/16/20 1400 Filed Weights   12/09/20 1507  Weight: 64.6 kg    Examination: General exam: Appears calm and comfortable with some apneic spells in between. Respiratory system: Clear to auscultation. Respiratory effort normal. Cardiovascular system: S1 & S2 heard, RRR. No JVD, murmurs, rubs, gallops or clicks. No pedal edema. Gastrointestinal system: Abdomen is nondistended, soft and nontender. No organomegaly or masses felt. Normal bowel sounds heard.   Procedures:  None  Microbiology summarized: COVID-19 PCR nonreactive. MRSA PCR  screen negative Blood cultures NGTD Urine culture without significant growth.  Assessment & Plan: End-of-life care/comfort measures only/DNR/DNI -Appreciate help by palliative medicine team -Plan for transfer to residential hospice when bed available if stable for transfer. Patient comfortable at this point in time.  Acute hypoxemic respiratory failure due to postobstructive pneumonia in the setting of possible lung neoplasm: No respiratory distress on 1 L. A. fib with RVR:  Possible diastolic heart failure Alzheimer's dementia/delirium: Pyuria/bacteriuria:  Hypokalemia/hypomagnesemia: Resolved Normocytic anemia: Stable Hyperlipidemia:  Debility/physical deconditioning Goal of care/DNR/DNI-appropriate.  Body mass index is 23.7 kg/m.         DVT prophylaxis:  None.  End-of-life care.  Code Status: DNR/DNI Family Communication: No family at bedside. Level of care: Med-Surg Status is: Inpatient  Remains inpatient appropriate because:Unsafe d/c plan   Dispo: The patient is from: SNF              Anticipated d/c is to: Residential hospice              Anticipated d/c date is: 1 day              Patient currently is medically stable to d/c.   Difficult to place patient No       Consultants:  Palliative medicine   Sch Meds:  Scheduled Meds:  Continuous Infusions:  PRN Meds:.acetaminophen, albuterol, antiseptic oral rinse, glycopyrrolate **OR** glycopyrrolate, haloperidol lactate, LORazepam, morphine injection, ondansetron **OR** ondansetron (ZOFRAN) IV, polyvinyl alcohol  Antimicrobials: Anti-infectives (From admission, onward)   Start  Dose/Rate Route Frequency Ordered Stop   12/10/20 1600  vancomycin (VANCOREADY) IVPB 1750 mg/350 mL  Status:  Discontinued        1,750 mg 175 mL/hr over 120 Minutes Intravenous Every 48 hours 12/08/20 1637 12/09/20 1023   12/09/20 1700  vancomycin (VANCOREADY) IVPB 1000 mg/200 mL  Status:  Discontinued        1,000  mg 200 mL/hr over 60 Minutes Intravenous Every 24 hours 12/09/20 1023 12/10/20 0751   12/09/20 0500  cefTRIAXone (ROCEPHIN) 2 g in sodium chloride 0.9 % 100 mL IVPB  Status:  Discontinued        2 g 200 mL/hr over 30 Minutes Intravenous Every 24 hours 12/08/20 2126 12/10/20 1928   12/08/20 2130  doxycycline (VIBRAMYCIN) 100 mg in sodium chloride 0.9 % 250 mL IVPB  Status:  Discontinued        100 mg 125 mL/hr over 120 Minutes Intravenous Every 12 hours 12/08/20 2126 12/10/20 1928   12/08/20 1600  vancomycin (VANCOREADY) IVPB 1500 mg/300 mL        1,500 mg 150 mL/hr over 120 Minutes Intravenous  Once 12/08/20 1554 12/08/20 1906   12/08/20 1545  ceFEPIme (MAXIPIME) 2 g in sodium chloride 0.9 % 100 mL IVPB        2 g 200 mL/hr over 30 Minutes Intravenous  Once 12/08/20 1534 12/08/20 1703       I have personally reviewed the following labs and images: CBC: No results for input(s): WBC, NEUTROABS, HGB, HCT, MCV, PLT in the last 168 hours. BMP &GFR Recent Labs  Lab 12/09/20 1526 12/10/20 0259  NA  --  139  K  --  3.6  CL  --  104  CO2  --  24  GLUCOSE  --  75  BUN  --  9  CREATININE  --  0.73  CALCIUM  --  8.6*  MG 1.6* 2.2   Estimated Creatinine Clearance: 46.3 mL/min (by C-G formula based on SCr of 0.73 mg/dL). Liver & Pancreas: No results for input(s): AST, ALT, ALKPHOS, BILITOT, PROT, ALBUMIN in the last 168 hours. No results for input(s): LIPASE, AMYLASE in the last 168 hours. No results for input(s): AMMONIA in the last 168 hours. Diabetic: No results for input(s): HGBA1C in the last 72 hours. No results for input(s): GLUCAP in the last 168 hours. Cardiac Enzymes: No results for input(s): CKTOTAL, CKMB, CKMBINDEX, TROPONINI in the last 168 hours. No results for input(s): PROBNP in the last 8760 hours. Coagulation Profile: No results for input(s): INR, PROTIME in the last 168 hours. Thyroid Function Tests: No results for input(s): TSH, T4TOTAL, FREET4, T3FREE,  THYROIDAB in the last 72 hours. Lipid Profile: No results for input(s): CHOL, HDL, LDLCALC, TRIG, CHOLHDL, LDLDIRECT in the last 72 hours. Anemia Panel: No results for input(s): VITAMINB12, FOLATE, FERRITIN, TIBC, IRON, RETICCTPCT in the last 72 hours. Urine analysis:    Component Value Date/Time   COLORURINE YELLOW 12/08/2020 1918   APPEARANCEUR CLEAR 12/08/2020 1918   LABSPEC 1.009 12/08/2020 1918   PHURINE 5.0 12/08/2020 1918   GLUCOSEU NEGATIVE 12/08/2020 1918   HGBUR NEGATIVE 12/08/2020 1918   BILIRUBINUR NEGATIVE 12/08/2020 1918   KETONESUR NEGATIVE 12/08/2020 1918   PROTEINUR NEGATIVE 12/08/2020 1918   NITRITE NEGATIVE 12/08/2020 1918   LEUKOCYTESUR LARGE (A) 12/08/2020 1918   Sepsis Labs: Invalid input(s): PROCALCITONIN, [place]  Microbiology: Recent Results (from the past 240 hour(s))  Resp Panel by RT-PCR (Flu A&B, Covid) Nasopharyngeal Swab  Status: None   Collection Time: 12/08/20  3:28 PM   Specimen: Nasopharyngeal Swab; Nasopharyngeal(NP) swabs in vial transport medium  Result Value Ref Range Status   SARS Coronavirus 2 by RT PCR NEGATIVE NEGATIVE Final    Comment: (NOTE) SARS-CoV-2 target nucleic acids are NOT DETECTED.  The SARS-CoV-2 RNA is generally detectable in upper respiratory specimens during the acute phase of infection. The lowest concentration of SARS-CoV-2 viral copies this assay can detect is 138 copies/mL. A negative result does not preclude SARS-Cov-2 infection and should not be used as the sole basis for treatment or other patient management decisions. A negative result may occur with  improper specimen collection/handling, submission of specimen other than nasopharyngeal swab, presence of viral mutation(s) within the areas targeted by this assay, and inadequate number of viral copies(<138 copies/mL). A negative result must be combined with clinical observations, patient history, and epidemiological information. The expected result is  Negative.  Fact Sheet for Patients:  EntrepreneurPulse.com.au  Fact Sheet for Healthcare Providers:  IncredibleEmployment.be  This test is no t yet approved or cleared by the [place] FDA and  has been authorized for detection and/or diagnosis of SARS-CoV-2 by FDA under an Emergency Use Authorization (EUA). This EUA will remain  in effect (meaning this test can be used) for the duration of the COVID-19 declaration under Section 564(b)(1) of the Act, 21 U.S.C.section 360bbb-3(b)(1), unless the authorization is terminated  or revoked sooner.       Influenza A by PCR NEGATIVE NEGATIVE Final   Influenza B by PCR NEGATIVE NEGATIVE Final    Comment: (NOTE) The Xpert Xpress SARS-CoV-2/FLU/RSV plus assay is intended as an aid in the diagnosis of influenza from Nasopharyngeal swab specimens and should not be used as a sole basis for treatment. Nasal washings and aspirates are unacceptable for Xpert Xpress SARS-CoV-2/FLU/RSV testing.  Fact Sheet for Patients: EntrepreneurPulse.com.au  Fact Sheet for Healthcare Providers: IncredibleEmployment.be  This test is not yet approved or cleared by the [place] FDA and has been authorized for detection and/or diagnosis of SARS-CoV-2 by FDA under an Emergency Use Authorization (EUA). This EUA will remain in effect (meaning this test can be used) for the duration of the COVID-19 declaration under Section 564(b)(1) of the Act, 21 U.S.C. section 360bbb-3(b)(1), unless the authorization is terminated or revoked.  Performed at [facility], [place] [address] [place], [place] [address]   Culture, blood (routine x 2)     Status: None   Collection Time: 12/08/20  3:47 PM   Specimen: BLOOD  Result Value Ref Range Status   Specimen Description BLOOD LEFT ANTECUBITAL  Final   Special Requests   Final    BOTTLES DRAWN AEROBIC ONLY Blood Culture results may not be  optimal due to an inadequate volume of blood received in culture bottles   Culture   Final    NO GROWTH 5 DAYS Performed at [facility], [place] [address], [place], [place] [address]    Report Status 12/13/2020 FINAL  Final  Culture, blood (routine x 2)     Status: None   Collection Time: 12/08/20  4:23 PM   Specimen: BLOOD  Result Value Ref Range Status   Specimen Description BLOOD SITE NOT SPECIFIED  Final   Special Requests   Final    BOTTLES DRAWN AEROBIC ONLY Blood Culture results may not be optimal due to an inadequate volume of blood received in culture bottles   Culture   Final  NO GROWTH 5 DAYS Performed at [facility], [place] [address], [place], [place] [address]    Report Status 12/13/2020 FINAL  Final  Culture, Urine     Status: Abnormal   Collection Time: 12/08/20  8:10 PM   Specimen: Urine, Random  Result Value Ref Range Status   Specimen Description URINE, RANDOM  Final   Special Requests NONE  Final   Culture (A)  Final    <10,000 COLONIES/mL INSIGNIFICANT GROWTH Performed at [facility], [place] [address], [place], [place] [address]    Report Status 12/10/2020 FINAL  Final  MRSA PCR Screening     Status: None   Collection Time: 12/09/20 10:03 AM   Specimen: Nasopharyngeal Swab  Result Value Ref Range Status   MRSA by PCR NEGATIVE NEGATIVE Final    Comment:        The GeneXpert MRSA Assay (FDA approved for NASAL specimens only), is one component of a comprehensive MRSA colonization surveillance program. It is not intended to diagnose MRSA infection nor to guide or monitor treatment for MRSA infections. Performed at [facility], [place] [address], [place], [place] [address]   Respiratory (~20 pathogens) panel by PCR     Status: None   Collection Time: 12/09/20  2:42 PM   Specimen: Nasopharyngeal Swab; Respiratory  Result Value Ref Range Status   Adenovirus NOT DETECTED NOT DETECTED Final   Coronavirus 229E NOT DETECTED NOT DETECTED  Final    Comment: (NOTE) The Coronavirus on the Respiratory Panel, DOES NOT test for the novel  Coronavirus (2019 nCoV)    Coronavirus HKU1 NOT DETECTED NOT DETECTED Final   Coronavirus NL63 NOT DETECTED NOT DETECTED Final   Coronavirus OC43 NOT DETECTED NOT DETECTED Final   Metapneumovirus NOT DETECTED NOT DETECTED Final   Rhinovirus / Enterovirus NOT DETECTED NOT DETECTED Final   Influenza A NOT DETECTED NOT DETECTED Final   Influenza B NOT DETECTED NOT DETECTED Final   Parainfluenza Virus 1 NOT DETECTED NOT DETECTED Final   Parainfluenza Virus 2 NOT DETECTED NOT DETECTED Final   Parainfluenza Virus 3 NOT DETECTED NOT DETECTED Final   Parainfluenza Virus 4 NOT DETECTED NOT DETECTED Final   Respiratory Syncytial Virus NOT DETECTED NOT DETECTED Final   Bordetella pertussis NOT DETECTED NOT DETECTED Final   Bordetella Parapertussis NOT DETECTED NOT DETECTED Final   Chlamydophila pneumoniae NOT DETECTED NOT DETECTED Final   Mycoplasma pneumoniae NOT DETECTED NOT DETECTED Final    Comment: Performed at [facility], [place]. [address] [place],  [address]    Radiology Studies: No results found.  Darliss Cheney, MD [facility]  If 7PM-7AM, please contact night-coverage [email or web address] 12/16/2020, 2:00 PM

## 2020-12-16 NOTE — Progress Notes (Signed)
Daily Progress Note   Patient Name: Brandy Wright       Date: 12/16/2020 DOB: 12/06/1932  Age: 87 y.o. MRN#: [id number] Physical Exam         Neuro: Unresponsive to voice and light touch Pulmonary: Respirations even and unlabored Cardiac: regular rate and rhythm   Attending Physician: Darliss Cheney, MD Primary Care Physician: Patient, No Pcp Per Admit Date: 12/08/2020  Reason for Follow-up: end of life care, symptom management  Subjective: Patient appears comfortable. Unresponsive to voice and light touch. No non-verbal signs of pain or discomfort noted. Respirations are even and unlabored. No excessive respiratory secretions noted. Daughter present at bedside. Discussed that patient would be transferring to hospice facility in [facility] this afternoon. Daughter is concerned about agitation and distress during transport. I recommended having the RN administer a dose of ativan prior to transport.  Daughter expresses appreciation for palliative support during her mother's hospitalization.   Length of Stay: 8  Current Medications:    PRN Meds: acetaminophen, albuterol, antiseptic oral rinse, glycopyrrolate **OR** glycopyrrolate, haloperidol lactate, LORazepam, morphine injection, ondansetron **OR** ondansetron (ZOFRAN) IV, polyvinyl alcohol         Vital Signs: BP 113/64 (BP Location: Left Arm)   Pulse (!) 104   Temp 98.8 F (37.1 C) (Axillary)   Resp 20   Ht 5\' 5"  (1.651 m)   Wt 64.6 kg   SpO2 (!) 62%   BMI 23.70 kg/m  SpO2: SpO2: (!) 62 % O2 Device: O2 Device: Room Air O2 Flow Rate: O2 Flow Rate (L/min): 2 L/min  Intake/output summary: No intake or output data in the 24 hours ending 12/16/20 1543 LBM: Last BM Date: 12/12/20 Baseline Weight: Weight: 64.6 kg Most recent  weight: Weight: 64.6 kg       Palliative Assessment/Data: PPS 10%      Palliative Care Assessment & Plan   HPI/Patient Profile:87 y.o.femalewith past medical history of asthma, dementia, GERD, and frequent falls presented to the emergency departmenton 2/21/2022with confusion and shortness of breath.She reportedly had been diagnosed with pneumonia at her SNF on 2/18. On EMS arrival to SNF, o2 saturation was around 80% and she was tachycardic in the 160's. Also with BLE edema which is reportedly new.  ED Course:Confusion turned into agitated delirium. Found to be in A fib with RVR, started  on cardizem gtt. Chest x-ray showing likely pneumonia. WBC normal, no fever. BNP 306.6, troponin negative x 2, lactic acid normal. Patient was admitted to [facility] for acute hypoxic respiratory failure, acute metabolic encephalopathy, and A-fib with RVR.  Assessment: - acute hypoxic respiratory failure due to postobstructive pneumonia in the setting of possible lung malignancy - A-fib with RVR - possible diastolic heart failure - Alzheimer's dementia - failure to thrive - end of life care  Recommendations/Plan:  Continue full comfort care  Patient discharging to hospice facility in [facility] today  Nursing please administer a dose of lorazepam (ATIVAN) 1 mg IV prior to transport  Goals of Care and Additional Recommendations:  Limitations on Scope of Treatment: Full Comfort Care  Code Status:  DNR/DNI  Prognosis:  Days  Discharge Planning:  Hospice facility  Care plan was discussed with nursing  Thank you for allowing the Palliative Medicine Team to assist in the care of this patient.   Total Time 15 minutes Prolonged Time Billed  no       Greater than 50%  of this time was spent counseling and coordinating care related to the above assessment and plan.  Lavena Bullion, NP  Please contact Palliative Medicine Team phone at [phone] for questions and concerns.

## 2020-12-16 NOTE — TOC Progression Note (Signed)
Transition of Care [facility]) - Progression Note    Patient Details  Name: Brandy Wright MRN: [id number] Date of Birth: 07-19-1933  [facility] CM/SW Contact  Joanne Chars, LCSW Phone Number: 12/16/2020, 10:24 AM  Clinical Narrative:   Update from [place], [place] [facility] [place].  No bed available today.     Expected Discharge Plan: [place] Barriers to Discharge: No Barriers Identified  Expected Discharge Plan and Services Expected Discharge Plan: [place] In-house Referral: Clinical Social Work   Post Acute Care Choice: Hospice Living arrangements for the past 2 months: [place]                                       Social Determinants of Health (SDOH) Interventions    Readmission Risk Interventions No flowsheet data found.

## 2020-12-16 NOTE — Progress Notes (Signed)
   We continue to call and support the family as we are unfortunately able to offer a bed at this time due to capacity at our [facility]. We have updated transitions of care dept. If something comes open later this afternoon we will reach out. Webb Silversmith RN [phone]

## 2021-01-12 IMAGING — CT CT CERVICAL SPINE W/O CM
4 of 7 series · 14 of 33 positions shown, 15 images · non-contrast
Comparison: CT head and CT cervical spine September 27, 2020

CLINICAL DATA: Pain following fall

EXAM:
CT HEAD WITHOUT CONTRAST
CT CERVICAL SPINE WITHOUT CONTRAST
TECHNIQUE: Multidetector CT imaging of the head and cervical spine was
performed following the standard protocol without intravenous
contrast. Multiplanar CT image reconstructions of the cervical spine
were also generated.

[Series 4: coronal soft tissue · coronal · 0.32mm/px · 3 of 71 slices shown]
[im 18/71  bone]
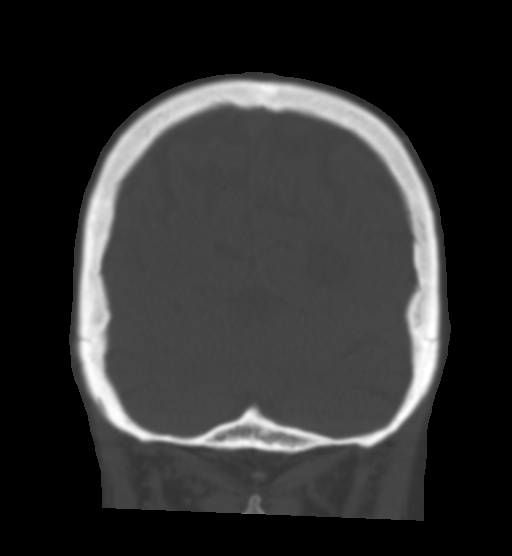
[im 36/71  bone]
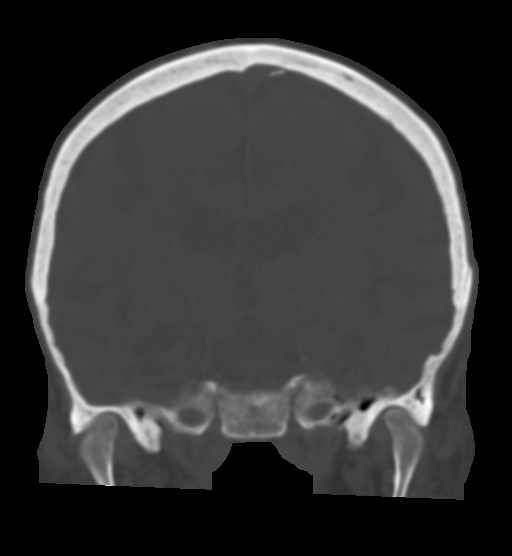
[im 53/71  bone]
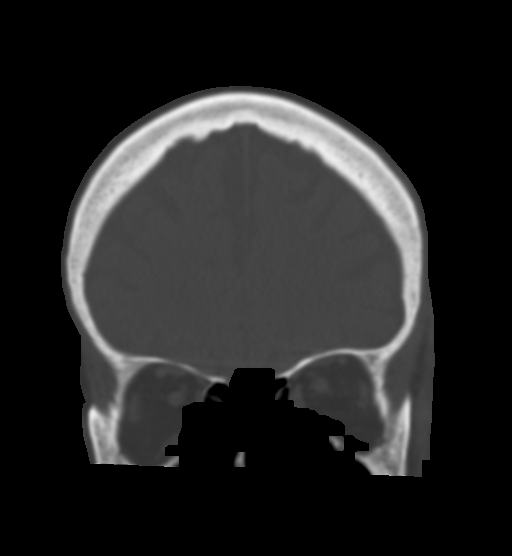

[Series 8: c spine soft · axial · 0.35mm/px · z∈[-214,-178]mm · 2 of 91 slices shown]
[im 19/91  soft-tissue]
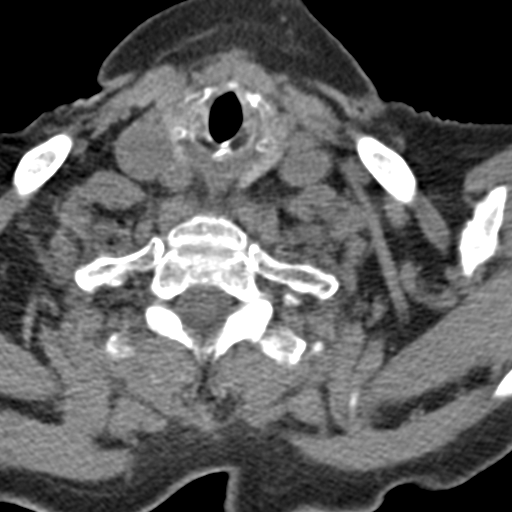
[im 37/91  soft-tissue]
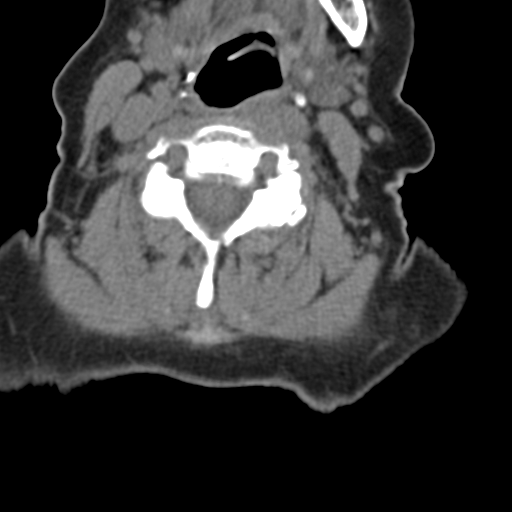

[Series 11: orthogonal bone · axial · 0.23mm/px · z∈[-254,-166]mm · 4 of 82 slices shown, 5 images]
[im 17/82  soft-tissue]
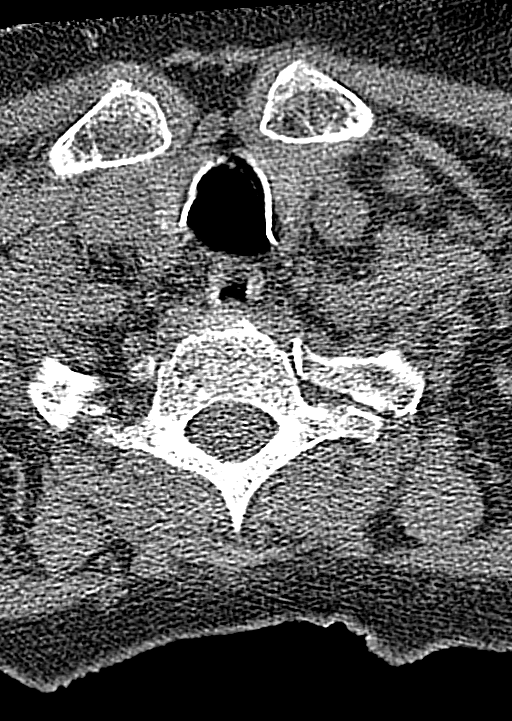
[im 17/82  bone]
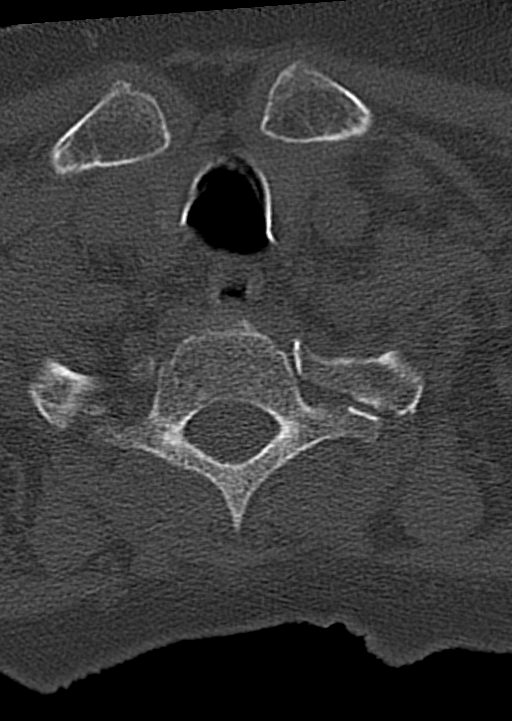
[im 33/82  bone]
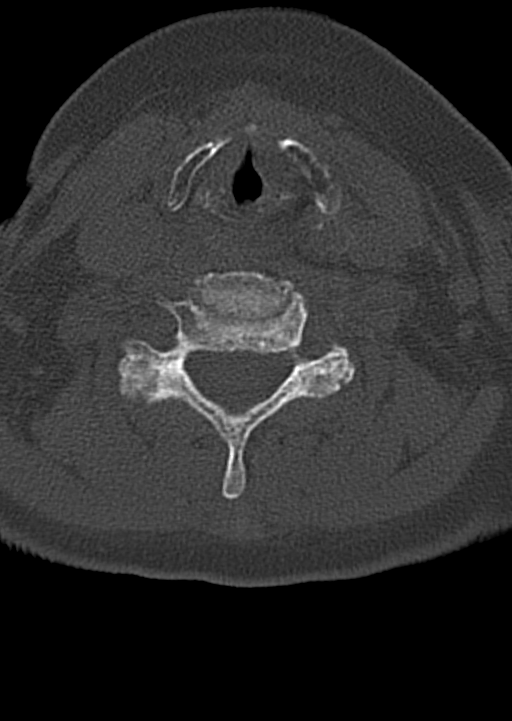
[im 49/82  bone]
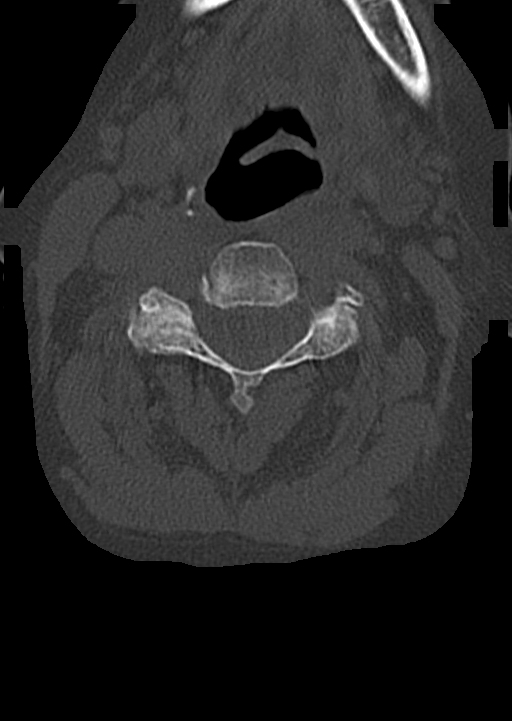
[im 65/82  bone]
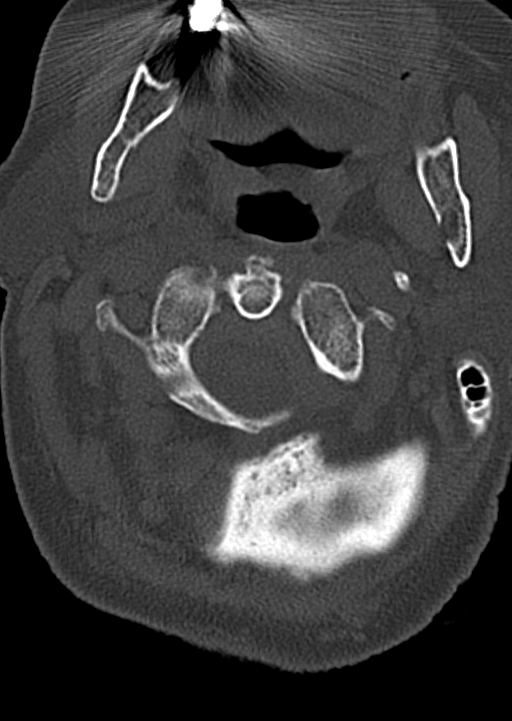

[Series 13: sagittal bone · sagittal · 0.30mm/px · 5 of 61 slices shown]
[im 11/61  bone]
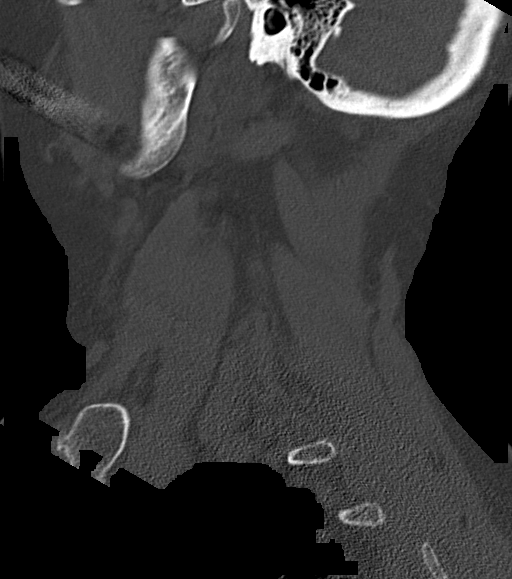
[im 21/61  bone]
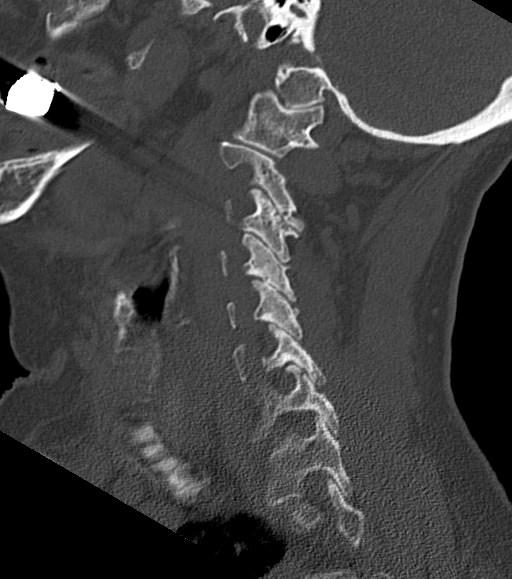
[im 31/61  bone]
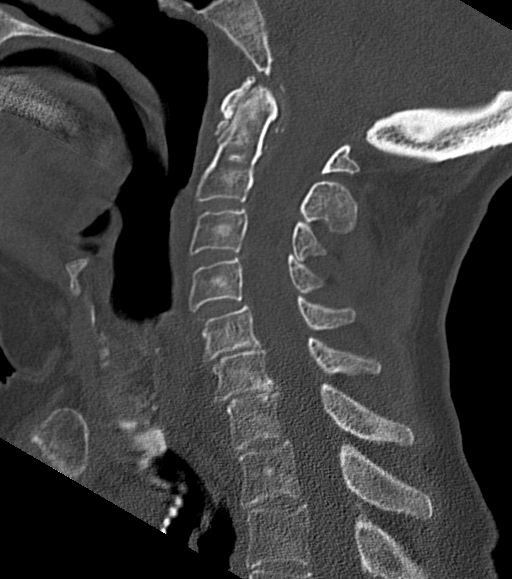
[im 41/61  bone]
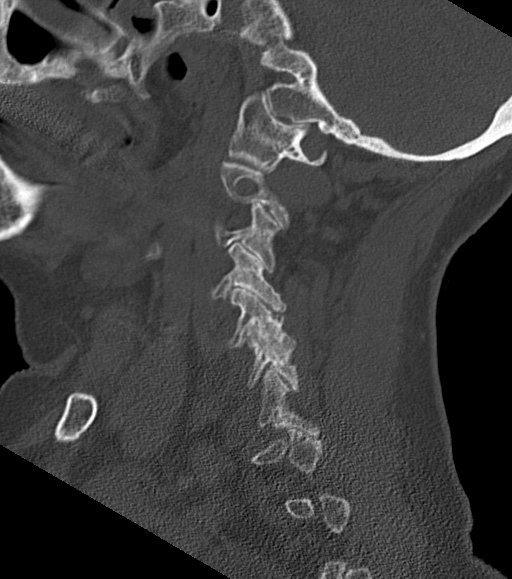
[im 51/61  bone]
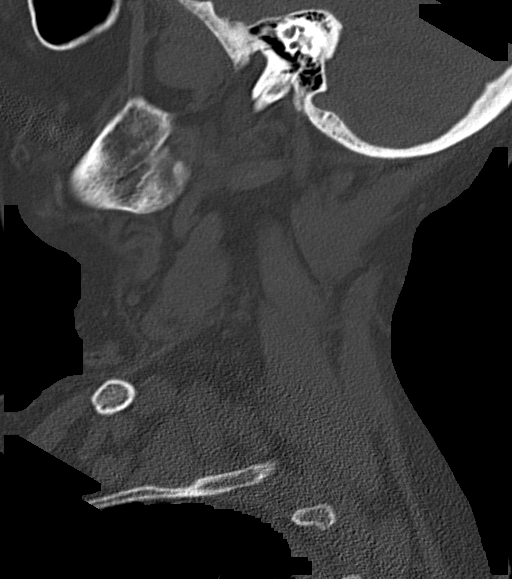

[14 of 33 positions shown; findings below may reference images not displayed]

FINDINGS: CT HEAD FINDINGS

Brain: Stable mild to moderate diffuse atrophy. There is no
intracranial mass, hemorrhage, extra-axial fluid collection, or
midline shift. Patchy small vessel disease in the centra semiovale
bilaterally is stable. No acute appearing infarct is evident.

Vascular: There is no hyperdense vessel. There is calcification in
the carotid siphon regions.

Skull: Bony calvarium appears intact. There is a left frontal scalp
hematoma.

Sinuses/Orbits: Postoperative antrostomies with areas of mucosal
thickening, stable. Mucosal thickening and ill-defined opacification
in areas of nasal cavity and ethmoid air cells, stable. Visualized
orbits appear symmetric bilaterally.

Other: Mastoid air cells are clear.

CT CERVICAL SPINE FINDINGS

Alignment: There is 1 mm of anterolisthesis of C4 on C5, stable.
There is 2 mm of anterolisthesis of C5 on C6, stable. There is 1 mm
of anterolisthesis of C6 on C7, stable. No new spondylolisthesis.

Skull base and vertebrae: The skull base and craniocervical junction
appear stable. There is mild pannus posterior to the odontoid which
does not impress upon the craniocervical junction. There is a degree
of underlying osteoporosis. No evident fracture. No blastic or lytic
bone lesions.

Soft tissues and spinal canal: Prevertebral soft tissues and
predental space regions are normal. There is no appreciable cord or
canal hematoma. No paraspinous lesions.

Disc levels: Disc space narrowing is again noted at C5-6 and C6-7.
There is facet hypertrophy at multiple levels, stable. No nerve root
edema or effacement. No disc extrusion or stenosis.

Upper chest: There is chronic and stable appearing pleural
thickening and fibrosis in the visualized upper lung regions. No new
opacity evident in these areas.

Other: There are foci of carotid artery calcification bilaterally.
There is also calcification in the proximal right subclavian artery.
IMPRESSION: CT head: Stable atrophy with periventricular small vessel disease.
No mass, hemorrhage, or extra-axial fluid collection. No acute
infarct evident.

Left frontal scalp hematoma.  No fracture.

Foci of arterial vascular calcification. Postoperative changes in
maxillary antra with areas of ethmoid and nasal cavity opacification
and mucosal thickening.

CT cervical spine: No fracture. Stable slight spondylolisthesis at
C4-5, C5-6 and C6-7, likely due to underlying spondylosis. No new
spondylolisthesis. Osteoarthritic change at multiple levels. No
frank disc extrusion or stenosis.

Scarring and fibrosis with pleural thickening in the visualized
upper lung regions, stable. Foci of arterial vascular calcification
at several sites.

## 2021-01-16 DEATH — deceased
